# Patient Record
Sex: Male | Born: 1965 | Race: White | Hispanic: No | Marital: Married | Smoking: Never smoker
Health system: Southern US, Community
[De-identification: ages and names within clinical notes are randomized; demographics above are authoritative.]

## PROBLEM LIST (undated history)

## (undated) DIAGNOSIS — I499 Cardiac arrhythmia, unspecified: Secondary | ICD-10-CM

## (undated) DIAGNOSIS — J45909 Unspecified asthma, uncomplicated: Secondary | ICD-10-CM

## (undated) DIAGNOSIS — S43006A Unspecified dislocation of unspecified shoulder joint, initial encounter: Secondary | ICD-10-CM

## (undated) DIAGNOSIS — M87059 Idiopathic aseptic necrosis of unspecified femur: Secondary | ICD-10-CM

## (undated) DIAGNOSIS — R519 Headache, unspecified: Secondary | ICD-10-CM

## (undated) DIAGNOSIS — T7840XA Allergy, unspecified, initial encounter: Secondary | ICD-10-CM

## (undated) HISTORY — DX: Headache, unspecified: R51.9

## (undated) HISTORY — DX: Unspecified asthma, uncomplicated: J45.909

## (undated) HISTORY — DX: Cardiac arrhythmia, unspecified: I49.9

## (undated) HISTORY — DX: Allergy, unspecified, initial encounter: T78.40XA

## (undated) HISTORY — PX: OTHER SURGICAL HISTORY: SHX169

## (undated) HISTORY — DX: Idiopathic aseptic necrosis of unspecified femur: M87.059

## (undated) HISTORY — DX: Unspecified dislocation of unspecified shoulder joint, initial encounter: S43.006A

---
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
## 1988-04-17 HISTORY — PX: WISDOM TOOTH EXTRACTION: SHX21

## 1990-04-17 HISTORY — PX: CYSTECTOMY: SUR359

## 1990-04-17 HISTORY — PX: VASECTOMY: SHX75

## 2000-04-17 HISTORY — PX: OTHER SURGICAL HISTORY: SHX169

## 2005-04-17 HISTORY — PX: OTHER SURGICAL HISTORY: SHX169

## 2006-09-11 ENCOUNTER — Encounter: Payer: Self-pay | Admitting: Internal Medicine

## 2006-09-11 ENCOUNTER — Ambulatory Visit: Payer: Self-pay | Admitting: Internal Medicine

## 2006-09-11 DIAGNOSIS — J45909 Unspecified asthma, uncomplicated: Secondary | ICD-10-CM | POA: Insufficient documentation

## 2006-09-11 LAB — CONVERTED CEMR LAB
ALT: 25 units/L (ref 0–40)
AST: 22 units/L (ref 0–37)
Albumin: 4.3 g/dL (ref 3.5–5.2)
Alkaline Phosphatase: 40 units/L (ref 39–117)
BUN: 12 mg/dL (ref 6–23)
Basophils Absolute: 0 10*3/uL (ref 0.0–0.1)
Basophils Relative: 0.3 % (ref 0.0–1.0)
Bilirubin, Direct: 0.1 mg/dL (ref 0.0–0.3)
CO2: 34 meq/L — ABNORMAL HIGH (ref 19–32)
Calcium: 9.4 mg/dL (ref 8.4–10.5)
Chloride: 103 meq/L (ref 96–112)
Cholesterol: 189 mg/dL (ref 0–200)
Creatinine, Ser: 1.1 mg/dL (ref 0.4–1.5)
Eosinophils Absolute: 0.3 10*3/uL (ref 0.0–0.6)
Eosinophils Relative: 3.7 % (ref 0.0–5.0)
GFR calc Af Amer: 95 mL/min
GFR calc non Af Amer: 79 mL/min
Glucose, Bld: 100 mg/dL — ABNORMAL HIGH (ref 70–99)
HCT: 45.2 % (ref 39.0–52.0)
HDL: 52 mg/dL (ref >39.0)
Hemoglobin: 15.5 g/dL (ref 13.0–17.0)
LDL Cholesterol: 117 mg/dL — ABNORMAL HIGH (ref 0–99)
Lymphocytes Relative: 35.4 % (ref 12.0–46.0)
MCHC: 34.3 g/dL (ref 30.0–36.0)
MCV: 94.7 fL (ref 78.0–100.0)
Monocytes Absolute: 0.6 10*3/uL (ref 0.2–0.7)
Monocytes Relative: 6.6 % (ref 3.0–11.0)
Neutro Abs: 4.5 10*3/uL (ref 1.4–7.7)
Neutrophils Relative %: 54 % (ref 43.0–77.0)
Platelets: 196 10*3/uL (ref 150–400)
Potassium: 3.7 meq/L (ref 3.5–5.1)
RBC: 4.77 M/uL (ref 4.22–5.81)
RDW: 12 % (ref 11.5–14.6)
Sodium: 141 meq/L (ref 135–145)
TSH: 1.57 microintl units/mL (ref 0.35–5.50)
Total Bilirubin: 0.9 mg/dL (ref 0.3–1.2)
Total CHOL/HDL Ratio: 3.6
Total Protein: 7.2 g/dL (ref 6.0–8.3)
Triglycerides: 99 mg/dL (ref 0–149)
VLDL: 20 mg/dL (ref 0–40)
WBC: 8.4 10*3/uL (ref 4.5–10.5)

## 2007-07-17 ENCOUNTER — Encounter: Payer: Self-pay | Admitting: Internal Medicine

## 2007-08-22 ENCOUNTER — Telehealth: Payer: Self-pay | Admitting: Internal Medicine

## 2007-08-22 ENCOUNTER — Emergency Department (HOSPITAL_COMMUNITY): Admission: EM | Admit: 2007-08-22 | Discharge: 2007-08-22 | Payer: Self-pay | Admitting: Emergency Medicine

## 2007-08-26 ENCOUNTER — Ambulatory Visit: Payer: Self-pay | Admitting: Internal Medicine

## 2007-08-26 DIAGNOSIS — L03317 Cellulitis of buttock: Secondary | ICD-10-CM

## 2007-08-26 DIAGNOSIS — L0231 Cutaneous abscess of buttock: Secondary | ICD-10-CM | POA: Insufficient documentation

## 2010-11-28 ENCOUNTER — Telehealth: Payer: Self-pay | Admitting: *Deleted

## 2010-11-28 NOTE — Telephone Encounter (Signed)
Last tetanus 2008---doesn't need one now

## 2010-11-28 NOTE — Telephone Encounter (Signed)
Pt aware.

## 2010-11-28 NOTE — Telephone Encounter (Signed)
[facility] Triage Call Report Triage Record Num: [id number] Operator: Audelia Hives Patient Name: Albert Schmidt Call Date & Time: 11/27/2010 2:46:37PM Patient Phone: [phone] PCP: Valetta Mole. Swords Patient Gender: Male PCP Fax : [phone] Patient DOB: 1965/07/10 [facility] Name: [facility] Reason for Call: [name] calling regarding stepped on rusty nail, barely broke the skin. No bleeding. Unsure if Tetanus UTD. Cleaned well with soap and water.Emergent s/s for Abrasions, Lacerations, Puncture Wounds r/o per protocol except for see in 24 hours since unsure if tetanus UTD, will call in am to see if UTD. Protocol(s) Used: Abrasions, Lacerations, Puncture Wounds Recommended Outcome per Protocol: See Provider within 24 hours Reason for Outcome: Tetanus immunization not up to date Care Advice: Call provider if wound or area around wound becomes increasingly red or painful, there is a purulent or foul smelling discharge, red streaks develop leading away from wound, or if you develop any temperature elevation. ~ ~ IMMEDIATE ACTION Tetanus immunization must be given as soon as possible after injury, usually within 72 hours, IF: - immunization status is unknown, never immunized, or fewer than 3 doses given, - it has been 10 years or more since last immunization; - OR if it has been 5 years or more since last booster, AND wound is a deep, is a puncture wound, or is a tetanus-prone wound. Keep an up-to-date record of your immunizations so unnecessary repeat doses are not given. ~ 11/27/2010 3:01:24PM Page 1 of 1 [id number]

## 2013-02-17 ENCOUNTER — Encounter: Payer: Self-pay | Admitting: Nurse Practitioner

## 2013-02-20 ENCOUNTER — Ambulatory Visit: Payer: Self-pay | Admitting: Nurse Practitioner

## 2017-01-17 LAB — HM COLONOSCOPY

## 2020-01-24 ENCOUNTER — Other Ambulatory Visit: Payer: Self-pay

## 2020-01-24 ENCOUNTER — Emergency Department (HOSPITAL_COMMUNITY): Payer: BC Managed Care – PPO

## 2020-01-24 ENCOUNTER — Emergency Department (HOSPITAL_COMMUNITY)
Admission: EM | Admit: 2020-01-24 | Discharge: 2020-01-24 | Disposition: A | Discharge status: Home / Self Care | Payer: BC Managed Care – PPO | Attending: Emergency Medicine | Admitting: Emergency Medicine

## 2020-01-24 DIAGNOSIS — S43005A Unspecified dislocation of left shoulder joint, initial encounter: Secondary | ICD-10-CM

## 2020-01-24 DIAGNOSIS — Z87891 Personal history of nicotine dependence: Secondary | ICD-10-CM | POA: Insufficient documentation

## 2020-01-24 DIAGNOSIS — S4992XA Unspecified injury of left shoulder and upper arm, initial encounter: Secondary | ICD-10-CM | POA: Diagnosis present

## 2020-01-24 DIAGNOSIS — W010XXA Fall on same level from slipping, tripping and stumbling without subsequent striking against object, initial encounter: Secondary | ICD-10-CM | POA: Insufficient documentation

## 2020-01-24 MED ORDER — FENTANYL CITRATE (PF) 100 MCG/2ML IJ SOLN
50.0000 ug | Freq: Once | INTRAMUSCULAR | Status: AC
  Administered 2020-01-24: 50 ug via INTRAVENOUS
  Filled 2020-01-24: qty 2

## 2020-01-24 MED ORDER — DIAZEPAM 5 MG/ML IJ SOLN
5.0000 mg | Freq: Once | INTRAMUSCULAR | Status: AC
  Administered 2020-01-24: 5 mg via INTRAVENOUS
  Filled 2020-01-24: qty 2

## 2020-01-24 MED ORDER — LIDOCAINE HCL (PF) 1 % IJ SOLN
10.0000 mg | Freq: Once | INTRAMUSCULAR | Status: AC
  Administered 2020-01-24: 10 mg
  Filled 2020-01-24: qty 10

## 2020-01-24 MED ORDER — HYDROMORPHONE HCL 1 MG/ML IJ SOLN: 1.0000 mg | Freq: Once | INTRAMUSCULAR | Status: DC

## 2020-01-24 MED ORDER — HYDROCODONE-ACETAMINOPHEN 5-325 MG PO TABS
1.0000 | ORAL_TABLET | Freq: Four times a day (QID) | ORAL | Status: DC | PRN
  Administered 2020-01-24: 1 via ORAL
  Filled 2020-01-24: qty 1

## 2020-01-24 NOTE — Discharge Instructions (Addendum)
Your shoulder was reduced.  I placed you in a sling, please wear for first week and then he can wear as needed..  I recommend taking over-the-counter pain medication like ibuprofen and or Tylenol every 6 hours as needed please follow dosing the back of bottle.  You may apply ice to the area as this will help decrease inflammation and swelling.  It is more they follow-up with Ortho for further evaluation.  Come back to emergency department if develop pins and needle sensation in her hand, if worsening shoulder pain, chest pain, shortness breath, severe abdominal pain, uncontrolled nausea, vomiting, diarrhea.

## 2020-01-24 NOTE — ED Provider Notes (Signed)
[facility] Provider Note   CSN: [id number] Arrival date & time: 01/24/20  0844     History Chief Complaint  Patient presents with  . Shoulder Injury    Albert Schmidt is a 54 y.o. male.  HPI   Patient with significant medical history of avascular necrosis of femoral head as well as prior left shoulder dislocation presents to the emergency department with chief complaint of left shoulder pain possible shoulder dislocation.  Patient states he was outside when he slipped on the wet ground causing him to fall forward,  he threw his arm out onto the deck to catch himself and felt his left shoulder pop.  He denies hitting his head, losing conscious, being on anticoags.  He states he had immediate pain in his left shoulder and was unable to lift it or move his shoulder.  He denies paresthesias, weakness, difficulty moving his wrist or fingers on his left side.  He has not taking any pain medication.  He denies any alleviating factors.  Patient denies headache, fever, chills, shortness breath, chest pain, dumping, nausea vomiting or diarrhea.  Past Medical History:  Diagnosis Date  . Avascular necrosis of femoral head    Bilaterally-     Patient Active Problem List   Diagnosis Date Noted  . CELLULITIS/ABSCESS, BUTTOCK 08/26/2007  . ASTHMA 09/11/2006        Family History  Problem Relation Age of Onset  . Migraines Mother     Social History   Tobacco Use  . Smoking status: Former Smoker    Quit date: 02/03/2013    Years since quitting: 6.9  Substance Use Topics  . Alcohol use: Yes    Comment: daily  . Drug use: No    Home Medications Prior to Admission medications   Medication Sig Start Date End Date Taking? Authorizing Provider  DULoxetine (CYMBALTA) 60 MG capsule Take 60 mg by mouth daily.    [provider]  fluticasone (FLONASE) 50 MCG/ACT nasal spray Place 2 sprays into the nose daily.    [provider]    naproxen sodium (ANAPROX) 220 MG tablet Take 220 mg by mouth 2 (two) times daily with a meal.    [provider]  nortriptyline (PAMELOR) 50 MG capsule Take 50 mg by mouth at bedtime. 2 tabs    [provider]  SUMAtriptan (IMITREX) 100 MG tablet Take 100 mg by mouth as needed for migraine. May repeat in 2 hours if headache persists or recurs.    [provider]  traMADol (ULTRAM) 50 MG tablet Take 50 mg by mouth every 6 (six) hours as needed for pain.    [provider]    Allergies    Patient has no known allergies.  Review of Systems   Review of Systems  Constitutional: Negative for chills and fever.  HENT: Negative for congestion, tinnitus, trouble swallowing and voice change.   Eyes: Negative for visual disturbance.  Respiratory: Negative for cough and shortness of breath.   Cardiovascular: Negative for chest pain.  Gastrointestinal: Negative for abdominal pain, diarrhea, nausea and vomiting.  Genitourinary: Negative for enuresis, flank pain and frequency.  Musculoskeletal: Negative for back pain.       Left shoulder pain.  Skin: Negative for rash.  Neurological: Negative for dizziness and headaches.  Hematological: Does not bruise/bleed easily.    Physical Exam Updated Vital Signs BP 119/72   Pulse (!) 57   Temp 98.4 F (36.9 C) (Oral)  Resp 18   Ht 5\' 8"  (1.727 m)   Wt 88.5 kg   SpO2 96%   BMI 29.65 kg/m   Physical Exam Vitals and nursing note reviewed.  Constitutional:      General: He is in acute distress.     Appearance: He is not ill-appearing.  HENT:     Head: Normocephalic and atraumatic.     Nose: No congestion.     Mouth/Throat:     Mouth: Mucous membranes are moist.     Pharynx: Oropharynx is clear.  Eyes:     General: No scleral icterus. Cardiovascular:     Rate and Rhythm: Normal rate and regular rhythm.     Pulses: Normal pulses.     Heart sounds: No murmur heard.  No friction rub. No gallop.    Pulmonary:     Effort: No respiratory distress.     Breath sounds: No wheezing, rhonchi or rales.  Abdominal:     General: There is no distension.     Palpations: Abdomen is soft.     Tenderness: There is no abdominal tenderness. There is no right CVA tenderness, left CVA tenderness or guarding.  Musculoskeletal:        General: Tenderness and deformity present. No swelling.     Right lower leg: No edema.     Left lower leg: No edema.     Comments: Patient's left shoulder was visualized he had a squared looking shoulder, unable to flex, extend, adductor AB duct at the shoulder, he did have full range of motion 5 out of strength at the elbow wrist and fingers.  Neurovascular fully intact.  Skin:    General: Skin is warm and dry.     Capillary Refill: Capillary refill takes less than 2 seconds.     Findings: No rash.  Neurological:     Mental Status: He is alert.  Psychiatric:        Mood and Affect: Mood normal.     ED Results / Procedures / Treatments   Labs (all labs ordered are listed, but only abnormal results are displayed) Labs Reviewed - No data to display  EKG None  Radiology DG Shoulder Left  Result Date: 01/24/2020 CLINICAL DATA:  Fall on left shoulder while walking dog today. Left shoulder pain and bruising. EXAM: LEFT SHOULDER - 2+ VIEW COMPARISON:  None. FINDINGS: Anterior dislocation of the humeral head is seen. No fractures or other bone lesions identified. IMPRESSION: Anterior shoulder dislocation. No evidence of fracture. Electronically Signed   By: Marlaine Hind M.D.   On: 01/24/2020 10:09   DG Shoulder Left Portable  Result Date: 01/24/2020 CLINICAL DATA:  Post reduction EXAM: LEFT SHOULDER COMPARISON:  January 24, 2020 FINDINGS: Interval relocation of the LEFT shoulder. Small Hill-Sachs deformity. No definitive glenoid fracture visualized. No additional fracture noted. Soft tissues are unremarkable. IMPRESSION: Interval relocation of the LEFT shoulder.  Electronically Signed   By: Valentino Saxon MD   On: 01/24/2020 12:07    Procedures Reduction of dislocation  Date/Time: 01/24/2020 11:04 AM Performed by: Marcello Fennel, PA-C Authorized by: Marcello Fennel, PA-C  Preparation: Patient was prepped and draped in the usual sterile fashion. Local anesthesia used: yes Anesthesia: hematoma block  Anesthesia: Local anesthesia used: yes Local Anesthetic: lidocaine 1% without epinephrine Anesthetic total: 10 mL  Sedation: Patient sedated: no  Patient tolerance: patient tolerated the procedure well with no immediate complications Comments: Shoulder was successfully reduced, he had full range of motion at  the shoulder and neurovascular is fully intact.    (including critical care time)  Medications Ordered in ED Medications  HYDROcodone-acetaminophen (NORCO/VICODIN) 5-325 MG per tablet 1 tablet (1 tablet Oral Given 01/24/20 0925)  HYDROmorphone (DILAUDID) injection 1 mg (1 mg Intravenous Not Given 01/24/20 0910)  diazepam (VALIUM) injection 5 mg (5 mg Intravenous Given 01/24/20 0955)  fentaNYL (SUBLIMAZE) injection 50 mcg (50 mcg Intravenous Given 01/24/20 1013)  lidocaine (PF) (XYLOCAINE) 1 % injection 10 mg (10 mg Infiltration Given by Other 01/24/20 3094)    ED Course  I have reviewed the triage vital signs and the nursing notes.  Pertinent labs & imaging results that were available during my care of the patient were reviewed by me and considered in my medical decision making (see chart for details).    MDM Rules/Calculators/A&P                          I have personally reviewed all imaging, labs and have interpreted them.  Patient presents with left shoulder pain.  He was alert, appeared to be in acute distress, vital signs reassuring.  Will order portable left shoulder x-ray for further evaluation.  Shoulder shows anterior shoulder dislocation no evidence of fracture.  Will proceed with hematoma block.  Will provide  patient with 2 mg of Valium, 50 fentanyl and 10 mg of lidocaine 1% without epi.  Shoulder reduction was successful able to reduce dislocation without difficulty.  Neurovascular is fully intact.  Will order portable x-ray for reevaluation.  Shoulder x-ray shows interval relocation of left shoulder with no other acute abnormalities.  Low suspicion for compartment syndrome or nerve damage as he had full range of motion at the shoulder, elbow, wrist and fingers as well as neurovascular intact after the procedure was performed.  Low suspicion for fracture or dislocation as x-ray does not show any acute abnormalities.  Low suspicion for ligament or tendon damage as he had full range of motion at the shoulder, elbow, wrist, fingers after procedure.  I suspect patient had a shoulder dislocation which was successfully rereduced.  Will place patient in a sling and have him follow-up with Ortho for further evaluation.  Vital signs have remained stable, no indication for hospital admission.  Patient discussed with attending who agrees assessment plan.  Patient was given at home capital strict return precautions.  Patient verbalized understood agreed to plan. Final Clinical Impression(s) / ED Diagnoses Final diagnoses:  Dislocation of left shoulder joint, initial encounter    Rx / DC Orders ED Discharge Orders    None       Marcello Fennel, PA-C 01/24/20 1224    Gareth Morgan, MD 01/25/20 [phone]

## 2020-01-24 NOTE — ED Triage Notes (Signed)
Pt slipped on a tree root while walking his dog this morning, reached out to catch himself, resulting in L shoulder pain and deformity. Dislocated once in his 12s with no problem since then.

## 2020-01-24 NOTE — ED Notes (Signed)
Pt requesting something for pain.  

## 2020-01-24 NOTE — Progress Notes (Signed)
Orthopedic Tech Progress Note Patient Details:  Albert Schmidt 1965/05/19 [id number]  Ortho Devices Type of Ortho Device: Shoulder immobilizer Ortho Device/Splint Location: LUE Ortho Device/Splint Interventions: Ordered, Application, Adjustment   Post Interventions Patient Tolerated: Well Instructions Provided: Care of device, Adjustment of device, Poper ambulation with device   [name] 01/24/2020, 11:50 AM

## 2020-01-26 ENCOUNTER — Telehealth: Payer: Self-pay

## 2020-01-26 NOTE — Telephone Encounter (Signed)
Scheduled patient F/U with Dr. Ninfa Linden for left shoulder.

## 2020-01-28 ENCOUNTER — Encounter: Payer: Self-pay | Admitting: Orthopaedic Surgery

## 2020-01-28 ENCOUNTER — Ambulatory Visit: Payer: BC Managed Care – PPO | Admitting: Orthopaedic Surgery

## 2020-01-28 DIAGNOSIS — S43015D Anterior dislocation of left humerus, subsequent encounter: Secondary | ICD-10-CM

## 2020-01-28 NOTE — Progress Notes (Signed)
Office Visit Note   Patient: Albert Schmidt           Date of Birth: October 21, 1965           MRN: [id number] Visit Date: 01/28/2020              Requested by: No referring provider defined for this encounter. PCP: Patient, No Pcp Per   Assessment & Plan: Visit Diagnoses:  1. Anterior dislocation of left shoulder, subsequent encounter     Plan: He will be careful with his left shoulder over the next few weeks.  He will come in and out of sling as comfort allows but avoid overhead activities and reaching behind him with the left side.  Also avoid external rotation.  We will see him back in 2 weeks with repeat 3 views of the left shoulder.  All questions and concerns were answered and addressed.  Follow-Up Instructions: Return in about 2 weeks (around 02/11/2020).   Orders:  No orders of the defined types were placed in this encounter.  No orders of the defined types were placed in this encounter.     Procedures: No procedures performed   Clinical Data: No additional findings.   Subjective: Chief Complaint  Patient presents with  . 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  He sustained an accidental mechanical fall walking his dog Saturday and he sustained a left shoulder anterior dislocation.  He was seen in the emergency room and this was reduced by the ER staff.  He does report a previous dislocation about 25 years ago with that shoulder when he was in graduate school.  He said he rehabbed through it and it never had any issues with the shoulder prior to this most recent fall.  He denies any numbness and tingling with his left upper extremity.  He has been wearing a sling and compliant with sling but does come out of it for hygiene and does not sleep in a sling.  HPI  Review of Systems There is currently listed no headache, chest pain, shortness of breath, fever, chills, nausea, vomiting  Objective: Vital Signs: There were  no vitals taken for this visit.  Physical Exam He is alert and orient x3 and in no acute distress Ortho Exam Examination of his left shoulder shows clinically it is well located.  He can use his rotator cuff to abduct the shoulder to 90 degrees on his own. Specialty Comments:  No specialty comments available.  Imaging: No results found.  Pre and post reduction x-rays of the left shoulder show an anterior dislocation and then a postreduction film shows the shoulder well located. PMFS History: Patient Active Problem List   Diagnosis Date Noted  . CELLULITIS/ABSCESS, BUTTOCK 08/26/2007  . ASTHMA 09/11/2006   Past Medical History:  Diagnosis Date  . Avascular necrosis of femoral head (HCC)    Bilaterally-     Family History  Problem Relation Age of Onset  . Migraines Mother     Past Surgical History:  Procedure Laterality Date  . Left hip replaced  2002  . Right femur  2007  . Right hip    . VASECTOMY  1992   Social History   Occupational History    Employer: [employer]  Tobacco Use  . Smoking status: Former Smoker    Quit date: 02/03/2013    Years since quitting: 6.9  Substance and Sexual Activity  . Alcohol use: Yes  Comment: daily  . Drug use: No  . Sexual activity: Not on file

## 2020-02-11 ENCOUNTER — Ambulatory Visit: Payer: BC Managed Care – PPO | Admitting: Orthopaedic Surgery

## 2020-02-11 ENCOUNTER — Ambulatory Visit: Payer: Self-pay

## 2020-02-11 ENCOUNTER — Encounter: Payer: Self-pay | Admitting: Orthopaedic Surgery

## 2020-02-11 DIAGNOSIS — S43015D Anterior dislocation of left humerus, subsequent encounter: Secondary | ICD-10-CM | POA: Diagnosis not present

## 2020-02-11 NOTE — Progress Notes (Signed)
The patient is now 2 and half weeks status post a left shoulder dislocation.  He is 54 years old.  This happened after hard mechanical fall.  This shoulder was reduced by the ER staff.  He has been wearing his sling and coming out of it only for hygiene purposes.  On exam his shoulder is clinically well located on the left side.  He does have a positive apprehension sign but he is moving the shoulder well and the rotator cuff seems to be intact.  3 views left shoulder show that it is well located and in good position with no acute findings.  At this point he can stop wearing his sling.  He will avoid external rotation of the shoulder and the extremes of abduction.  I like to see him back in 4 weeks for repeat exam and determine at that point whether or not therapy would be warranted.  If he does have symptoms of instability of the shoulder we will consider a MRI arthrogram but we will only do that if it presents itself clinically.  All questions and concerns were answered and addressed.

## 2020-03-09 ENCOUNTER — Ambulatory Visit: Payer: BC Managed Care – PPO | Admitting: Orthopaedic Surgery

## 2020-03-09 ENCOUNTER — Encounter: Payer: Self-pay | Admitting: Orthopaedic Surgery

## 2020-03-09 DIAGNOSIS — S43015D Anterior dislocation of left humerus, subsequent encounter: Secondary | ICD-10-CM

## 2020-03-09 NOTE — Progress Notes (Signed)
HPI: Albert Schmidt returns today now just 6 weeks status post left shoulder dislocation.  He has had no further injury to the left shoulder.  He feels his range of motion is improving.  Some soreness.  Does not awaken him at night denies any numbness tingling down the arm.  Physical exam left shoulder forward flexion 170 degrees.  Negative apprehension test.  5-5 strength with external/internal rotation against resistance and empty can test on the left.  Impression: Status post left shoulder dislocation 01/24/2020  Plan: We will send him to formal therapy to work on range of motion strengthening of the left shoulder.  We will see him back in a month to see how he is doing overall.  Questions encouraged and answered at length.

## 2020-04-07 ENCOUNTER — Ambulatory Visit: Payer: BC Managed Care – PPO | Admitting: Orthopaedic Surgery

## 2021-01-17 ENCOUNTER — Ambulatory Visit: Payer: BC Managed Care – PPO | Admitting: Internal Medicine

## 2021-01-17 ENCOUNTER — Encounter: Payer: Self-pay | Admitting: Internal Medicine

## 2021-01-17 ENCOUNTER — Other Ambulatory Visit: Payer: Self-pay

## 2021-01-17 VITALS — BP 126/84 | HR 63 | Temp 98.4°F | Resp 16 | Ht 68.0 in | Wt 186.0 lb

## 2021-01-17 DIAGNOSIS — Z125 Encounter for screening for malignant neoplasm of prostate: Secondary | ICD-10-CM

## 2021-01-17 DIAGNOSIS — Z23 Encounter for immunization: Secondary | ICD-10-CM | POA: Diagnosis not present

## 2021-01-17 DIAGNOSIS — L309 Dermatitis, unspecified: Secondary | ICD-10-CM

## 2021-01-17 DIAGNOSIS — D229 Melanocytic nevi, unspecified: Secondary | ICD-10-CM | POA: Diagnosis not present

## 2021-01-17 DIAGNOSIS — B351 Tinea unguium: Secondary | ICD-10-CM | POA: Diagnosis not present

## 2021-01-17 DIAGNOSIS — Z1159 Encounter for screening for other viral diseases: Secondary | ICD-10-CM | POA: Insufficient documentation

## 2021-01-17 DIAGNOSIS — Z0001 Encounter for general adult medical examination with abnormal findings: Secondary | ICD-10-CM | POA: Diagnosis not present

## 2021-01-17 DIAGNOSIS — M79675 Pain in left toe(s): Secondary | ICD-10-CM | POA: Diagnosis not present

## 2021-01-17 LAB — BASIC METABOLIC PANEL
BUN: 19 mg/dL (ref 6–23)
CO2: 29 mEq/L (ref 19–32)
Calcium: 9.9 mg/dL (ref 8.4–10.5)
Chloride: 102 mEq/L (ref 96–112)
Creatinine, Ser: 1.11 mg/dL (ref 0.40–1.50)
GFR: 74.96 mL/min (ref >60.00)
Glucose, Bld: 95 mg/dL (ref 70–99)
Potassium: 4.6 mEq/L (ref 3.5–5.1)
Sodium: 138 mEq/L (ref 135–145)

## 2021-01-17 LAB — CBC WITH DIFFERENTIAL/PLATELET
Basophils Absolute: 0 10*3/uL (ref 0.0–0.1)
Basophils Relative: 0.6 % (ref 0.0–3.0)
Eosinophils Absolute: 0.2 10*3/uL (ref 0.0–0.7)
Eosinophils Relative: 3.3 % (ref 0.0–5.0)
HCT: 44.8 % (ref 39.0–52.0)
Hemoglobin: 15 g/dL (ref 13.0–17.0)
Lymphocytes Relative: 32.2 % (ref 12.0–46.0)
Lymphs Abs: 1.9 10*3/uL (ref 0.7–4.0)
MCHC: 33.6 g/dL (ref 30.0–36.0)
MCV: 98.6 fl (ref 78.0–100.0)
Monocytes Absolute: 0.4 10*3/uL (ref 0.1–1.0)
Monocytes Relative: 6.6 % (ref 3.0–12.0)
Neutro Abs: 3.3 10*3/uL (ref 1.4–7.7)
Neutrophils Relative %: 57.3 % (ref 43.0–77.0)
Platelets: 166 10*3/uL (ref 150.0–400.0)
RBC: 4.55 Mil/uL (ref 4.22–5.81)
RDW: 13 % (ref 11.5–15.5)
WBC: 5.8 10*3/uL (ref 4.0–10.5)

## 2021-01-17 LAB — HEPATIC FUNCTION PANEL
ALT: 26 U/L (ref 0–53)
AST: 22 U/L (ref 0–37)
Albumin: 4.8 g/dL (ref 3.5–5.2)
Alkaline Phosphatase: 41 U/L (ref 39–117)
Bilirubin, Direct: 0.1 mg/dL (ref 0.0–0.3)
Total Bilirubin: 0.7 mg/dL (ref 0.2–1.2)
Total Protein: 7.2 g/dL (ref 6.0–8.3)

## 2021-01-17 LAB — LIPID PANEL
Cholesterol: 190 mg/dL (ref 0–200)
HDL: 69.6 mg/dL (ref >39.00)
LDL Cholesterol: 107 mg/dL — ABNORMAL HIGH (ref 0–99)
NonHDL: 120.37
Total CHOL/HDL Ratio: 3
Triglycerides: 68 mg/dL (ref 0.0–149.0)
VLDL: 13.6 mg/dL (ref 0.0–40.0)

## 2021-01-17 LAB — PSA: PSA: 0.51 ng/mL (ref 0.10–4.00)

## 2021-01-17 MED ORDER — FLUOCINONIDE EMULSIFIED BASE 0.05 % EX CREA: 1.0000 "application " | TOPICAL_CREAM | Freq: Two times a day (BID) | CUTANEOUS | 2 refills | Status: AC

## 2021-01-17 MED ORDER — TERBINAFINE HCL 250 MG PO TABS: 250.0000 mg | ORAL_TABLET | Freq: Every day | ORAL | 0 refills | Status: DC

## 2021-01-17 NOTE — Progress Notes (Deleted)
   Subjective:  Patient ID: Albert Schmidt, male    DOB: 01-31-1966  Age: 55 y.o. MRN: [id number]  CC: No chief complaint on file.   HPI Albert Schmidt presents for ***  History Albert Schmidt has a past medical history of Avascular necrosis of femoral head ([place]).   He has a past surgical history that includes Right hip; Right femur (2007); Left hip replaced (2002); and Vasectomy (1992).   His family history includes Migraines in his mother.He reports that he quit smoking about 7 years ago. He does not have any smokeless tobacco history on file. He reports current alcohol use. He reports that he does not use drugs.  Outpatient Medications Prior to Visit  Medication Sig Dispense Refill   DULoxetine (CYMBALTA) 60 MG capsule Take 60 mg by mouth daily.     fluticasone (FLONASE) 50 MCG/ACT nasal spray Place 2 sprays into the nose daily.     naproxen sodium (ANAPROX) 220 MG tablet Take 220 mg by mouth 2 (two) times daily with a meal.     nortriptyline (PAMELOR) 50 MG capsule Take 50 mg by mouth at bedtime. 2 tabs     SUMAtriptan (IMITREX) 100 MG tablet Take 100 mg by mouth as needed for migraine. May repeat in 2 hours if headache persists or recurs.     traMADol (ULTRAM) 50 MG tablet Take 50 mg by mouth every 6 (six) hours as needed for pain.     No facility-administered medications prior to visit.    ROS Review of Systems  Objective:  Ht 5\' 8"  (1.727 m)   Wt 186 lb (84.4 kg)   BMI 28.28 kg/m   Physical Exam  Lab Results  Component Value Date   WBC 8.4 09/11/2006   HGB 15.5 09/11/2006   HCT 45.2 09/11/2006   PLT 196 09/11/2006   GLUCOSE 100 (H) 09/11/2006   CHOL 189 09/11/2006   TRIG 99 09/11/2006   HDL 52.0 09/11/2006   LDLCALC 117 (H) 09/11/2006   ALT 25 09/11/2006   AST 22 09/11/2006   NA 141 09/11/2006   K 3.7 09/11/2006   CL 103 09/11/2006   CREATININE 1.1 09/11/2006   BUN 12 09/11/2006   CO2 34 (H) 09/11/2006   TSH 1.57 09/11/2006     Assessment & Plan:   There  are no diagnoses linked to this encounter. I have discontinued Albert Schmidt. Albert Schmidt's SUMAtriptan, DULoxetine, nortriptyline, fluticasone, traMADol, and naproxen sodium.  No orders of the defined types were placed in this encounter.    Follow-up: No follow-ups on file.  Scarlette Calico, MD

## 2021-01-17 NOTE — Progress Notes (Addendum)
Subjective:  Patient ID: Albert Schmidt, male    DOB: 1965-09-25  Age: 55 y.o. MRN: [id number]  CC: Annual Exam and Rash  This visit occurred during the SARS-CoV-2 public health emergency.  Safety protocols were in place, including screening questions prior to the visit, additional usage of staff PPE, and extensive cleaning of exam room while observing appropriate contact time as indicated for disinfecting solutions.    HPI Albert Schmidt presents for a CPX and to establish.  He complains of a several month hx of painful and discolored left great toenail.  He has had a hand rash and itching for many years and is having a recurrent outbreak that has not responded to OTC products. He complains of atypical moles. He is active and denies CP/DOE/diaphoresis/edema/fatigue.  History Albert Schmidt has a past medical history of Allergy, Arrhythmia, Asthma, Avascular necrosis of femoral head ([place]), Dislocated shoulder, and Frequent headaches.   He has a past surgical history that includes Vasectomy (04/17/1990); Bone tumor excision (1982); Wisdom tooth extraction (1990); Cystectomy (1992); and Tonsillectomy (1974).   His family history includes Alcohol abuse in his paternal grandfather; Anxiety disorder in his daughter; COPD in his maternal grandmother; Cancer in his maternal grandmother; Depression in his daughter; Diabetes in his maternal grandmother; Drug abuse in his paternal grandfather; Healthy in his brother, daughter, son, and son; Hearing loss in his father; Heart attack in his paternal grandfather; Heart disease in his maternal grandfather and paternal grandfather; Hyperlipidemia in his father, maternal grandmother, and mother; Hypertension in his maternal grandfather and maternal grandmother; Migraines in his mother.He reports that he quit smoking about 7 years ago. His smoking use included cigarettes. He has never used smokeless tobacco. He reports that he does not currently use alcohol. He  reports that he does not use drugs.  Outpatient Medications Prior to Visit  Medication Sig Dispense Refill   DULoxetine (CYMBALTA) 60 MG capsule Take 60 mg by mouth daily.     fluticasone (FLONASE) 50 MCG/ACT nasal spray Place 2 sprays into the nose daily.     naproxen sodium (ANAPROX) 220 MG tablet Take 220 mg by mouth 2 (two) times daily with a meal.     nortriptyline (PAMELOR) 50 MG capsule Take 50 mg by mouth at bedtime. 2 tabs     SUMAtriptan (IMITREX) 100 MG tablet Take 100 mg by mouth as needed for migraine. May repeat in 2 hours if headache persists or recurs.     traMADol (ULTRAM) 50 MG tablet Take 50 mg by mouth every 6 (six) hours as needed for pain.     No facility-administered medications prior to visit.    ROS Review of Systems  Constitutional:  Negative for chills, diaphoresis, fatigue and fever.  HENT: Negative.    Eyes: Negative.   Respiratory:  Negative for cough, chest tightness, shortness of breath and wheezing.   Cardiovascular:  Negative for chest pain, palpitations and leg swelling.  Gastrointestinal:  Negative for abdominal pain, blood in stool, constipation, diarrhea, nausea and vomiting.  Endocrine: Negative.   Genitourinary: Negative.  Negative for difficulty urinating, dysuria, hematuria, scrotal swelling, testicular pain and urgency.  Musculoskeletal: Negative.  Negative for arthralgias and myalgias.  Skin: Negative.  Negative for color change.  Neurological: Negative.  Negative for dizziness, weakness, light-headedness, numbness and headaches.  Hematological:  Negative for adenopathy. Does not bruise/bleed easily.  Psychiatric/Behavioral: Negative.     Objective:  BP 126/84 (BP Location: Right Arm, Patient Position: Sitting,  Cuff Size: Large)   Pulse 63   Temp 98.4 F (36.9 C) (Oral)   Resp 16   Ht 5\' 8"  (1.727 m)   Wt 186 lb (84.4 kg)   SpO2 98%   BMI 28.28 kg/m   Physical Exam Vitals reviewed.  HENT:     Nose: Nose normal.      Mouth/Throat:     Mouth: Mucous membranes are moist.  Eyes:     General: No scleral icterus.    Conjunctiva/sclera: Conjunctivae normal.  Cardiovascular:     Rate and Rhythm: Normal rate and regular rhythm.     Heart sounds: No murmur heard. Pulmonary:     Effort: Pulmonary effort is normal.     Breath sounds: No stridor. No wheezing, rhonchi or rales.  Abdominal:     General: Abdomen is flat.     Palpations: There is no mass.     Tenderness: There is no abdominal tenderness. There is no guarding or rebound.     Hernia: No hernia is present. There is no hernia in the left inguinal area or right inguinal area.  Genitourinary:    Pubic Area: No rash.      Penis: Normal and circumcised.      Testes: Normal.        Right: Mass or tenderness not present.        Left: Mass or tenderness not present.     Epididymis:     Right: Normal.     Left: Normal.     Prostate: Normal. Not enlarged, not tender and no nodules present.     Rectum: Normal. Guaiac result negative. No mass, tenderness, anal fissure, external hemorrhoid or internal hemorrhoid. Normal anal tone.  Musculoskeletal:        General: Normal range of motion.     Cervical back: Neck supple.  Lymphadenopathy:     Cervical: No cervical adenopathy.     Lower Body: No right inguinal adenopathy. No left inguinal adenopathy.  Skin:    General: Skin is warm and dry.     Findings: Erythema and rash present. Rash is papular, scaling and vesicular. Rash is not nodular, purpuric, pustular or urticarial.     Comments: Both hands - tiny vesicles, papules, erythema, scaling  Neurological:     General: No focal deficit present.     Mental Status: He is alert. Mental status is at baseline.  Psychiatric:        Mood and Affect: Mood normal.        Behavior: Behavior normal.    Lab Results  Component Value Date   WBC 5.8 01/17/2021   HGB 15.0 01/17/2021   HCT 44.8 01/17/2021   PLT 166.0 01/17/2021   GLUCOSE 95 01/17/2021   CHOL 190  01/17/2021   TRIG 68.0 01/17/2021   HDL 69.60 01/17/2021   LDLCALC 107 (H) 01/17/2021   ALT 26 01/17/2021   AST 22 01/17/2021   NA 138 01/17/2021   K 4.6 01/17/2021   CL 102 01/17/2021   CREATININE 1.11 01/17/2021   BUN 19 01/17/2021   CO2 29 01/17/2021   TSH 1.57 09/11/2006   PSA 0.51 01/17/2021     Assessment & Plan:   Vander was seen today for annual exam and rash.  Diagnoses and all orders for this visit:  Pain due to onychomycosis of toenail of left foot- Will treat with systemic terbinafine. -     CBC with Differential/Platelet; Future -     Basic metabolic  panel; Future -     Hepatic function panel; Future -     Hepatic function panel -     Basic metabolic panel -     CBC with Differential/Platelet -     terbinafine (LAMISIL) 250 MG tablet; Take 1 tablet (250 mg total) by mouth daily.  Encounter for general adult medical examination with abnormal findings- Exam completed, labs reviewed, vaccines reviewed and updated, cancer screenings are up-to-date, patient education was given. -     Lipid panel; Future -     PSA; Future -     Hepatitis C antibody; Future -     HIV Antibody (routine testing w rflx); Future -     HIV Antibody (routine testing w rflx) -     Hepatitis C antibody -     PSA -     Lipid panel  Chronic eczema of hand -     fluocinonide-emollient (LIDEX-E) 0.05 % cream; Apply 1 application topically 2 (two) times daily.  Need for hepatitis C screening test -     Hepatitis C antibody; Future -     Hepatitis C antibody  Multiple atypical nevi -     Ambulatory referral to Dermatology  Other orders -     Flu Vaccine QUAD 6+ mos PF IM (Fluarix Quad PF) -     Tdap vaccine greater than or equal to 7yo IM  I have discontinued Juel Burrow "Mike"'s SUMAtriptan, DULoxetine, nortriptyline, fluticasone, traMADol, and naproxen sodium. I am also having him start on fluocinonide-emollient and terbinafine.  Meds ordered this encounter  Medications    fluocinonide-emollient (LIDEX-E) 0.05 % cream    Sig: Apply 1 application topically 2 (two) times daily.    Dispense:  60 g    Refill:  2   terbinafine (LAMISIL) 250 MG tablet    Sig: Take 1 tablet (250 mg total) by mouth daily.    Dispense:  30 tablet    Refill:  0      Follow-up: Return in about 4 weeks (around 02/14/2021).  Scarlette Calico, MD

## 2021-01-17 NOTE — Patient Instructions (Signed)
Health Maintenance, Male Adopting a healthy lifestyle and getting preventive care are important in promoting health and wellness. Ask your health care provider about: The right schedule for you to have regular tests and exams. Things you can do on your own to prevent diseases and keep yourself healthy. What should I know about diet, weight, and exercise? Eat a healthy diet  Eat a diet that includes plenty of vegetables, fruits, low-fat dairy products, and lean protein. Do not eat a lot of foods that are high in solid fats, added sugars, or sodium. Maintain a healthy weight Body mass index (BMI) is a measurement that can be used to identify possible weight problems. It estimates body fat based on height and weight. Your health care provider can help determine your BMI and help you achieve or maintain a healthy weight. Get regular exercise Get regular exercise. This is one of the most important things you can do for your health. Most adults should: Exercise for at least 150 minutes each week. The exercise should increase your heart rate and make you sweat (moderate-intensity exercise). Do strengthening exercises at least twice a week. This is in addition to the moderate-intensity exercise. Spend less time sitting. Even light physical activity can be beneficial. Watch cholesterol and blood lipids Have your blood tested for lipids and cholesterol at 55 years of age, then have this test every 5 years. You may need to have your cholesterol levels checked more often if: Your lipid or cholesterol levels are high. You are older than 55 years of age. You are at high risk for heart disease. What should I know about cancer screening? Many types of cancers can be detected early and may often be prevented. Depending on your health history and family history, you may need to have cancer screening at various ages. This may include screening for: Colorectal cancer. Prostate cancer. Skin cancer. Lung  cancer. What should I know about heart disease, diabetes, and high blood pressure? Blood pressure and heart disease High blood pressure causes heart disease and increases the risk of stroke. This is more likely to develop in people who have high blood pressure readings, are of African descent, or are overweight. Talk with your health care provider about your target blood pressure readings. Have your blood pressure checked: Every 3-5 years if you are 18-39 years of age. Every year if you are 40 years old or older. If you are between the ages of 65 and 75 and are a current or former smoker, ask your health care provider if you should have a one-time screening for abdominal aortic aneurysm (AAA). Diabetes Have regular diabetes screenings. This checks your fasting blood sugar level. Have the screening done: Once every three years after age 45 if you are at a normal weight and have a low risk for diabetes. More often and at a younger age if you are overweight or have a high risk for diabetes. What should I know about preventing infection? Hepatitis B If you have a higher risk for hepatitis B, you should be screened for this virus. Talk with your health care provider to find out if you are at risk for hepatitis B infection. Hepatitis C Blood testing is recommended for: Everyone born from 1945 through 1965. Anyone with known risk factors for hepatitis C. Sexually transmitted infections (STIs) You should be screened each year for STIs, including gonorrhea and chlamydia, if: You are sexually active and are younger than 55 years of age. You are older than 55 years   of age and your health care provider tells you that you are at risk for this type of infection. Your sexual activity has changed since you were last screened, and you are at increased risk for chlamydia or gonorrhea. Ask your health care provider if you are at risk. Ask your health care provider about whether you are at high risk for HIV.  Your health care provider may recommend a prescription medicine to help prevent HIV infection. If you choose to take medicine to prevent HIV, you should first get tested for HIV. You should then be tested every 3 months for as long as you are taking the medicine. Follow these instructions at home: Lifestyle Do not use any products that contain nicotine or tobacco, such as cigarettes, e-cigarettes, and chewing tobacco. If you need help quitting, ask your health care provider. Do not use street drugs. Do not share needles. Ask your health care provider for help if you need support or information about quitting drugs. Alcohol use Do not drink alcohol if your health care provider tells you not to drink. If you drink alcohol: Limit how much you have to 0-2 drinks a day. Be aware of how much alcohol is in your drink. In the [place] one drink equals one 12 oz bottle of beer (355 mL), one 5 oz glass of wine (148 mL), or one 1 oz glass of hard liquor (44 mL). General instructions Schedule regular health, dental, and eye exams. Stay current with your vaccines. Tell your health care provider if: You often feel depressed. You have ever been abused or do not feel safe at home. Summary Adopting a healthy lifestyle and getting preventive care are important in promoting health and wellness. Follow your health care provider's instructions about healthy diet, exercising, and getting tested or screened for diseases. Follow your health care provider's instructions on monitoring your cholesterol and blood pressure. This information is not intended to replace advice given to you by your health care provider. Make sure you discuss any questions you have with your health care provider. Document Revised: 06/11/2020 Document Reviewed: 03/27/2018 Elsevier Patient Education  2022 Elsevier Inc.  

## 2021-01-18 ENCOUNTER — Encounter: Payer: Self-pay | Admitting: Internal Medicine

## 2021-01-18 LAB — HEPATITIS C ANTIBODY
Hepatitis C Ab: NONREACTIVE
SIGNAL TO CUT-OFF: 0.02 (ref <1.00)

## 2021-01-18 LAB — HIV ANTIBODY (ROUTINE TESTING W REFLEX): HIV 1&2 Ab, 4th Generation: NONREACTIVE

## 2021-01-24 ENCOUNTER — Telehealth: Payer: Self-pay

## 2021-01-24 ENCOUNTER — Telehealth: Payer: Self-pay | Admitting: Dermatology

## 2021-01-24 NOTE — Telephone Encounter (Signed)
This patient's referral was just generated today and was not yet assigned to an office. I did add a note to this referral informing that patient did not want to wait until April to be seen at this office.

## 2021-01-24 NOTE — Telephone Encounter (Signed)
Patient is calling for a referral appointment from Scarlette Calico, MD.  Patient does not want to wait until April 2023 for an appointment so would like referral sent back to Dr. Ronnald Ramp' office.

## 2021-01-24 NOTE — Telephone Encounter (Signed)
Please advise as the pt has stated the referral that was sent to dermatologist stated they can not see him until April. Pt is asking that a new referral be sent to [facility] TEL: [phone].

## 2021-02-15 NOTE — Progress Notes (Signed)
Subjective:    Patient ID: Albert Schmidt, male    DOB: 24-Jul-1965, 55 y.o.   MRN: [id number]  This visit occurred during the SARS-CoV-2 public health emergency.  Safety protocols were in place, including screening questions prior to the visit, additional usage of staff PPE, and extensive cleaning of exam room while observing appropriate contact time as indicated for disinfecting solutions.     HPI The patient is here for follow up of his onychomycosis.  He was started on oral terbinafine one month ago.    He is tolerating the medication well.  He denies side effects - no abdominal pain, no change in stool, no fever, nausea, gerd.  He would like to continue with the medication for the full 3 months.   Medications and allergies reviewed with patient and updated if appropriate.  Patient Active Problem List   Diagnosis Date Noted   Pain due to onychomycosis of toenail of left foot 01/17/2021   Encounter for general adult medical examination with abnormal findings 01/17/2021   Chronic eczema of hand 01/17/2021   Need for hepatitis C screening test 01/17/2021   Multiple atypical nevi 01/17/2021    Current Outpatient Medications on File Prior to Visit  Medication Sig Dispense Refill   fluocinonide-emollient (LIDEX-E) 0.05 % cream Apply 1 application topically 2 (two) times daily. 60 g 2   No current facility-administered medications on file prior to visit.    Past Medical History:  Diagnosis Date   Allergy    Arrhythmia    Asthma    Avascular necrosis of femoral head ([place])    Bilaterally-    Dislocated shoulder    left, 1993, 2021   Frequent headaches     Past Surgical History:  Procedure Laterality Date   BONE TUMOR [place]  04/17/1990   WISDOM TOOTH EXTRACTION  1990    Social History   Socioeconomic History   Marital status: Married    Spouse name: Not on file   Number of children: 3   Years of  education: Masters   Highest education level: Not on file  Occupational History    Employer: [employer]   Tobacco Use   Smoking status: Former    Types: Cigarettes    Quit date: 02/03/2013    Years since quitting: 8.0   Smokeless tobacco: Never  Substance and Sexual Activity   Alcohol use: Yes    Alcohol/week: 2.0 standard drinks    Types: 2 Cans of beer per week    Comment: daily   Drug use: No   Sexual activity: Yes    Partners: Female  Other Topics Concern   Not on file  Social History Narrative   Patient lives at home with his wife.   Patient has 3 children.   Patient works for [employer].   Patient is left-handed.   Patient has his Masters.   Patient drinks two cups of caffeine daily.   Social Determinants of Health   Financial Resource Strain: Not on file  Food Insecurity: Not on file  Transportation Needs: Not on file  Physical Activity: Not on file  Stress: Not on file  Social Connections: Not on file    Family History  Problem Relation Age of Onset   Hyperlipidemia Mother    Migraines Mother    Hearing loss Father    Hyperlipidemia Father    Healthy Brother  Anxiety disorder Daughter    Healthy Daughter    Depression Daughter    Healthy Son    Healthy Son    Hyperlipidemia Maternal Grandmother    Hypertension Maternal Grandmother    Diabetes Maternal Grandmother    COPD Maternal Grandmother    Cancer Maternal Grandmother        colon   Hypertension Maternal Grandfather    Heart disease Maternal Grandfather    Heart disease Paternal Grandfather    Drug abuse Paternal Grandfather    Alcohol abuse Paternal Grandfather    Heart attack Paternal Grandfather     Review of Systems     Objective:   Vitals:   02/16/21 0837  BP: 110/80  Pulse: 65  Temp: 98.2 F (36.8 C)  SpO2: 97%   BP Readings from Last 3 Encounters:  02/16/21 110/80  01/17/21 126/84  01/24/20 129/82   Wt Readings from Last 3 Encounters:  02/16/21 187 lb (84.8 kg)   01/17/21 186 lb (84.4 kg)  01/24/20 195 lb (88.5 kg)   Body mass index is 28.43 kg/m.   Physical Exam       Assessment & Plan:    Painful onychomycosis left first toenail: Chronic Completed one month of terbinafine w/o side effects Will check hepatic function today Continue terbinafine for 2 additional months - he will touch base with Dr Ronnald Ramp after the next month Discussed side effects to monitor for

## 2021-02-16 ENCOUNTER — Encounter: Payer: Self-pay | Admitting: Internal Medicine

## 2021-02-16 ENCOUNTER — Ambulatory Visit (INDEPENDENT_AMBULATORY_CARE_PROVIDER_SITE_OTHER): Payer: BC Managed Care – PPO | Admitting: Internal Medicine

## 2021-02-16 ENCOUNTER — Other Ambulatory Visit: Payer: Self-pay

## 2021-02-16 VITALS — BP 110/80 | HR 65 | Temp 98.2°F | Ht 68.0 in | Wt 187.0 lb

## 2021-02-16 DIAGNOSIS — M79675 Pain in left toe(s): Secondary | ICD-10-CM

## 2021-02-16 DIAGNOSIS — B351 Tinea unguium: Secondary | ICD-10-CM | POA: Diagnosis not present

## 2021-02-16 LAB — HEPATIC FUNCTION PANEL
ALT: 30 U/L (ref 0–53)
AST: 40 U/L — ABNORMAL HIGH (ref 0–37)
Albumin: 4.6 g/dL (ref 3.5–5.2)
Alkaline Phosphatase: 45 U/L (ref 39–117)
Bilirubin, Direct: 0.1 mg/dL (ref 0.0–0.3)
Total Bilirubin: 0.5 mg/dL (ref 0.2–1.2)
Total Protein: 7.2 g/dL (ref 6.0–8.3)

## 2021-02-16 MED ORDER — TERBINAFINE HCL 250 MG PO TABS: 250.0000 mg | ORAL_TABLET | Freq: Every day | ORAL | 1 refills | Status: DC

## 2021-02-16 NOTE — Patient Instructions (Signed)
    Blood work was ordered.     Medications changes include :  continue the terbinafine     Your prescription(s) have been submitted to your pharmacy. Please take as directed and contact our office if you believe you are having problem(s) with the medication(s).

## 2021-02-21 ENCOUNTER — Telehealth: Payer: Self-pay | Admitting: Internal Medicine

## 2021-02-21 NOTE — Telephone Encounter (Signed)
Pt scheduled for tomorrow 11/8 @ 9.20am

## 2021-02-21 NOTE — Telephone Encounter (Signed)
Patient states it is not uncommon for him to experience heart fluttering periodically  Patient states since taking terbinafine (LAMISIL) 250 MG tablet his heart is fluttering  more consistent  Patient is requesting a call back to discuss symptoms  Patient denied any other symptoms at this time

## 2021-02-22 ENCOUNTER — Encounter: Payer: Self-pay | Admitting: Internal Medicine

## 2021-02-22 ENCOUNTER — Other Ambulatory Visit: Payer: Self-pay

## 2021-02-22 ENCOUNTER — Ambulatory Visit: Payer: BC Managed Care – PPO | Admitting: Internal Medicine

## 2021-02-22 VITALS — BP 126/78 | HR 64 | Temp 98.5°F | Resp 16 | Ht 68.0 in | Wt 183.0 lb

## 2021-02-22 DIAGNOSIS — I491 Atrial premature depolarization: Secondary | ICD-10-CM | POA: Diagnosis not present

## 2021-02-22 DIAGNOSIS — R7989 Other specified abnormal findings of blood chemistry: Secondary | ICD-10-CM | POA: Diagnosis not present

## 2021-02-22 DIAGNOSIS — R002 Palpitations: Secondary | ICD-10-CM | POA: Diagnosis not present

## 2021-02-22 LAB — PROTIME-INR
INR: 1.1 ratio — ABNORMAL HIGH (ref 0.8–1.0)
Prothrombin Time: 12.3 s (ref 9.6–13.1)

## 2021-02-22 LAB — HEPATIC FUNCTION PANEL
ALT: 28 U/L (ref 0–53)
AST: 23 U/L (ref 0–37)
Albumin: 4.6 g/dL (ref 3.5–5.2)
Alkaline Phosphatase: 40 U/L (ref 39–117)
Bilirubin, Direct: 0.1 mg/dL (ref 0.0–0.3)
Total Bilirubin: 0.6 mg/dL (ref 0.2–1.2)
Total Protein: 7.2 g/dL (ref 6.0–8.3)

## 2021-02-22 NOTE — Progress Notes (Signed)
Subjective:  Patient ID: ROC STREETT, male    DOB: [birth year]  Age: 55 y.o. MRN: [id number]  CC: Palpitations  This visit occurred during the SARS-CoV-2 public health emergency.  Safety protocols were in place, including screening questions prior to the visit, additional usage of staff PPE, and extensive cleaning of exam room while observing appropriate contact time as indicated for disinfecting solutions.    HPI KAAN TOSH presents for f/up -  He describes having intermittent palpitations his entire adult life.  He states the palpitations have been more noticeable for the last 2 weeks.  He thinks terbinafine is attributed to this.  He describes intermittent episodes of missed beats, skipped beats, extra beats, and fluttering.  All of the symptoms happen at rest.  He denies chest pain, shortness of breath, dyspnea on exertion, diaphoresis, dizziness, lightheadedness, edema, or presyncope.  He recently had labs done by someone else and his liver enzymes were mildly elevated.  Outpatient Medications Prior to Visit  Medication Sig Dispense Refill   fluocinonide-emollient (LIDEX-E) 0.05 % cream Apply 1 application topically 2 (two) times daily. 60 g 2   terbinafine (LAMISIL) 250 MG tablet Take 1 tablet (250 mg total) by mouth daily. 30 tablet 1   No facility-administered medications prior to visit.    ROS Review of Systems  Constitutional:  Negative for chills, diaphoresis, fatigue and fever.  HENT: Negative.    Eyes: Negative.   Respiratory:  Negative for cough, chest tightness, shortness of breath and wheezing.   Cardiovascular:  Negative for chest pain, palpitations and leg swelling.  Gastrointestinal:  Negative for abdominal pain, diarrhea, nausea and vomiting.  Endocrine: Negative.   Genitourinary: Negative.  Negative for difficulty urinating.  Musculoskeletal:  Negative for arthralgias.  Skin: Negative.  Negative for color change.  Neurological:  Negative for dizziness,  weakness and light-headedness.  Hematological:  Negative for adenopathy. Does not bruise/bleed easily.  Psychiatric/Behavioral: Negative.     Objective:  BP 126/78 (BP Location: Left Arm, Patient Position: Sitting, Cuff Size: Large)   Pulse 64   Temp 98.5 F (36.9 C) (Oral)   Resp 16   Ht 5\' 8"  (1.727 m)   Wt 183 lb (83 kg)   SpO2 98%   BMI 27.83 kg/m   BP Readings from Last 3 Encounters:  02/22/21 126/78  02/16/21 110/80  01/17/21 126/84    Wt Readings from Last 3 Encounters:  02/22/21 183 lb (83 kg)  02/16/21 187 lb (84.8 kg)  01/17/21 186 lb (84.4 kg)    Physical Exam Vitals reviewed.  HENT:     Nose: Nose normal.     Mouth/Throat:     Mouth: Mucous membranes are moist.  Eyes:     General: No scleral icterus.    Conjunctiva/sclera: Conjunctivae normal.  Cardiovascular:     Rate and Rhythm: Normal rate. Occasional Extrasystoles are present.    Heart sounds: Normal heart sounds, S1 normal and S2 normal. No murmur heard.    Comments: EKG- SR with PAC's, 61 bpm Otherwise normal EKG Pulmonary:     Effort: Pulmonary effort is normal.     Breath sounds: No stridor. No wheezing, rhonchi or rales.  Abdominal:     General: Abdomen is flat.     Palpations: There is no mass.     Tenderness: There is no abdominal tenderness. There is no guarding.     Hernia: No hernia is present.  Musculoskeletal:        General: Normal range  of motion.     Cervical back: Neck supple.  Skin:    General: Skin is warm.  Neurological:     General: No focal deficit present.     Mental Status: He is alert. Mental status is at baseline.  Psychiatric:        Attention and Perception: Attention normal.        Mood and Affect: Mood is anxious.        Speech: Speech normal.        Behavior: Behavior normal.        Thought Content: Thought content normal.    Lab Results  Component Value Date   WBC 5.8 01/17/2021   HGB 15.0 01/17/2021   HCT 44.8 01/17/2021   PLT 166.0 01/17/2021    GLUCOSE 95 01/17/2021   CHOL 190 01/17/2021   TRIG 68.0 01/17/2021   HDL 69.60 01/17/2021   LDLCALC 107 (H) 01/17/2021   ALT 28 02/22/2021   AST 23 02/22/2021   NA 138 01/17/2021   K 4.6 01/17/2021   CL 102 01/17/2021   CREATININE 1.11 01/17/2021   BUN 19 01/17/2021   CO2 29 01/17/2021   TSH 2.57 02/22/2021   PSA 0.51 01/17/2021   INR 1.1 (H) 02/22/2021    DG Shoulder Left  Result Date: 01/24/2020 CLINICAL DATA:  Fall on left shoulder while walking dog today. Left shoulder pain and bruising. EXAM: LEFT SHOULDER - 2+ VIEW COMPARISON:  None. FINDINGS: Anterior dislocation of the humeral head is seen. No fractures or other bone lesions identified. IMPRESSION: Anterior shoulder dislocation. No evidence of fracture. Electronically Signed   By: Marlaine Hind M.D.   On: 01/24/2020 10:09   DG Shoulder Left Portable  Result Date: 01/24/2020 CLINICAL DATA:  Post reduction EXAM: LEFT SHOULDER COMPARISON:  January 24, 2020 FINDINGS: Interval relocation of the LEFT shoulder. Small Hill-Sachs deformity. No definitive glenoid fracture visualized. No additional fracture noted. Soft tissues are unremarkable. IMPRESSION: Interval relocation of the LEFT shoulder. Electronically Signed   By: Valentino Saxon MD   On: 01/24/2020 12:07    Assessment & Plan:   Brook was seen today for palpitations.  Diagnoses and all orders for this visit:  PAC (premature atrial contraction)- Though he is symptomatic with this he does not want to take a medication to treat it.  His labs are negative for secondary causes. -     EKG 12-Lead -     Thyroid Panel With TSH; Future -     CARDIAC EVENT MONITOR; Future -     Thyroid Panel With TSH  Palpitations- I recommended that he undergo an event monitor to be certain he does not have an additional dysrhythmia. -     EKG 12-Lead -     Thyroid Panel With TSH; Future -     CARDIAC EVENT MONITOR; Future -     Thyroid Panel With TSH  Elevated LFTs- His LFTs are normal  now.  Screening for viral hepatitis is negative.  I recommended that he get vaccinated against hepatitis A and B. -     Hepatitis B surface antigen; Future -     Hepatitis A antibody, total; Future -     Hepatitis B core antibody, total; Future -     Hepatitis B surface antibody,qualitative; Future -     Protime-INR; Future -     Hepatic function panel; Future -     Hepatic function panel -     Protime-INR -  Hepatitis B surface antibody,qualitative -     Hepatitis B core antibody, total -     Hepatitis A antibody, total -     Hepatitis B surface antigen  I have discontinued Juel Burrow "Mike"'s terbinafine. I am also having him maintain his fluocinonide-emollient.  No orders of the defined types were placed in this encounter.    Follow-up: Return in about 3 months (around 05/25/2021).  Scarlette Calico, MD

## 2021-02-22 NOTE — Patient Instructions (Signed)
Premature Atrial Contraction A premature atrial contraction [facility]) is a kind of irregular heartbeat (arrhythmia). It happens when the heart beats too early and then pauses before beating again. The heart has four areas, or chambers. Normally, electrical signals spread across the heart and make all the chambers beat together. During a PAC, the upper chambers of the heart (atria) beat too early, before they have had time to fill with blood. The heartbeat pauses afterward so the heart can fill with blood for the next beat. Sometimes PAC can be a warning sign of another type of arrhythmia called atrial fibrillation. Atrial fibrillation may allow blood to pool in the atria and form clots. If a clot travels to the brain, it can cause a stroke. What are the causes? The cause of this condition is often unknown. Sometimes, this condition may be caused by heart disease or injury to the heart. What increases the risk? You are more likely to develop this condition if: You are a child. You are an adult who is 78 years of age or older. Episodes may be triggered by: Caffeine. Alcohol. Tobacco use. Stimulant drugs. Some medicines or supplements. Stress. Heart disease. What are the signs or symptoms? Symptoms of this condition include: A feeling that your heart skipped a beat. The first heartbeat after the "skipped" beat may feel more forceful. A feeling that your heart is fluttering. How is this diagnosed? This condition is diagnosed based on: Your symptoms. A physical exam. Your health care provider may listen to your heart. An electrocardiogram (ECG). This is a test that records the electrical impulses of the heart. An ambulatory cardiac monitor. This device records your heartbeats for 24 hours or more. You may also have: An echocardiogram to check for any heart conditions. This is a type of imaging test that uses sound waves (ultrasound) to make images of your heart. Blood tests. How is this  treated? Treatment depends on the frequency of your symptoms and other risk factors. Treatments may include: Medicines (beta-blockers). Catheter ablation. This is done to destroy the part of the heart tissue that sends abnormal signals. In some cases, treatment may not be needed for this condition. Follow these instructions at home: Lifestyle Do not use any products that contain nicotine or tobacco, such as cigarettes, e-cigarettes, and chewing tobacco. If you need help quitting, ask your health care provider. Exercise regularly. Ask your health care provider what type of exercise is safe for you. Find healthy ways to manage stress. Try to get at least 7-9 hours of sleep each night, or as much as recommended by your health care provider. Alcohol use Do not drink alcohol if: Your health care provider tells you not to drink. You are pregnant, may be pregnant, or are planning to become pregnant. Alcohol triggers your episodes. If you drink alcohol: Limit how much you use to: 0-1 drink a day for women. 0-2 drinks a day for men. Be aware of how much alcohol is in your drink. In the [place] one drink equals one 12 oz bottle of beer (355 mL), one 5 oz glass of wine (148 mL), or one 1 oz glass of hard liquor (44 mL). General instructions Take over-the-counter and prescription medicines only as told by your health care provider. If caffeine triggers episodes, do not eat, drink, or use anything with caffeine in it. Keep all follow-up visits as told by your health care provider. This is important. Contact a health care provider if: You feel your heart skipping beats. Your  heart skips beats and you feel dizzy, light-headed, or very tired. Get help right away if you have: Chest pain. Trouble breathing. Any symptoms of a stroke. "BE FAST" is an easy way to remember the main warning signs of a stroke. B - Balance. Signs are dizziness, sudden trouble walking, or loss of balance. E - Eyes. Signs are  trouble seeing or a sudden change in vision. F - Face. Signs are sudden weakness or numbness of the face, or the face or eyelid drooping on one side. A - Arms. Signs are weakness or numbness in an arm. This happens suddenly and usually on one side of the body. S - Speech. Signs are sudden trouble speaking, slurred speech, or trouble understanding what people say. T - Time. Time to call emergency services. Write down what time symptoms started. Other signs of stroke, such as: A sudden, severe headache with no known cause. Nausea or vomiting. Seizure. These symptoms may represent a serious problem that is an emergency. Do not wait to see if the symptoms will go away. Get medical help right away. Call your local emergency services (911 in the [place] Do not drive yourself to the hospital. Summary A premature atrial contraction [facility]) is a kind of irregular heartbeat (arrhythmia). It happens when the heart beats too early and then pauses before beating again. Treatment depends on your symptoms and whether you have other underlying heart conditions. Contact a health care provider if your heart skips beats and you feel dizzy, light-headed, or very tired. In some cases, this condition may lead to a stroke. "BE FAST" is an easy way to remember the warning signs of stroke. Get help right away if you have any of the "BE FAST" signs. This information is not intended to replace advice given to you by your health care provider. Make sure you discuss any questions you have with your health care provider. Document Revised: 08/31/2020 Document Reviewed: 08/31/2020 Elsevier Patient Education  [place].

## 2021-02-23 LAB — HEPATITIS B SURFACE ANTIBODY,QUALITATIVE: Hep B S Ab: NONREACTIVE

## 2021-02-23 LAB — HEPATITIS B SURFACE ANTIGEN: Hepatitis B Surface Ag: NONREACTIVE

## 2021-02-23 LAB — THYROID PANEL WITH TSH
Free Thyroxine Index: 2 (ref 1.4–3.8)
T3 Uptake: 35 % (ref 22–35)
T4, Total: 5.8 ug/dL (ref 4.9–10.5)
TSH: 2.57 mIU/L (ref 0.40–4.50)

## 2021-02-23 LAB — HEPATITIS B CORE ANTIBODY, TOTAL: Hep B Core Total Ab: NONREACTIVE

## 2021-02-23 LAB — HEPATITIS A ANTIBODY, TOTAL: Hepatitis A AB,Total: NONREACTIVE

## 2021-03-08 ENCOUNTER — Ambulatory Visit (INDEPENDENT_AMBULATORY_CARE_PROVIDER_SITE_OTHER): Payer: BC Managed Care – PPO

## 2021-03-08 DIAGNOSIS — R002 Palpitations: Secondary | ICD-10-CM | POA: Diagnosis not present

## 2021-03-08 DIAGNOSIS — I491 Atrial premature depolarization: Secondary | ICD-10-CM

## 2021-03-21 ENCOUNTER — Telehealth: Payer: Self-pay | Admitting: Internal Medicine

## 2021-03-21 NOTE — Telephone Encounter (Signed)
I have spoke to the pt and recommended that he reach out to who provided the heart monitor to see if they have monitor adhesive placements for sensitive skin. He expressed understanding and would follow up with an updated if needed.

## 2021-03-21 NOTE — Telephone Encounter (Signed)
Patient calling in  Patient says the adhesive that was attached to his heart monitor recently came off  Patient says the adhesive was very uncomfortable & gave him a rash on his skin  Wants to know if provider wants him to continue wearing the monitor for more data or if hes okay to leave the monitor off  Please discuss w/ provider & contact patient [phone]

## 2021-04-13 ENCOUNTER — Telehealth: Payer: Self-pay | Admitting: Internal Medicine

## 2021-04-13 NOTE — Telephone Encounter (Signed)
Called pt, LVM stating 2 weeks of data would be enough and to call the office to discus further if needed. a

## 2021-04-13 NOTE — Telephone Encounter (Signed)
Patient informing provider monitoring company took 2 weeks to send adhesives for sensitive skin  Patient states due to the delay he was unable to restart sleep study due to delay being outside of the time frame  Patient inquiring if the 2 week study previously done will suffice or will a new study have to be ordered   Patient requesting a call back   *see below*

## 2021-04-13 NOTE — Telephone Encounter (Signed)
No note needed 

## 2021-04-25 ENCOUNTER — Other Ambulatory Visit: Payer: Self-pay | Admitting: Internal Medicine

## 2021-04-25 DIAGNOSIS — I4729 Other ventricular tachycardia: Secondary | ICD-10-CM | POA: Insufficient documentation

## 2021-04-26 ENCOUNTER — Encounter: Payer: Self-pay | Admitting: Internal Medicine

## 2021-05-09 ENCOUNTER — Other Ambulatory Visit: Payer: Self-pay | Admitting: Internal Medicine

## 2021-05-09 DIAGNOSIS — I4729 Other ventricular tachycardia: Secondary | ICD-10-CM

## 2021-05-09 NOTE — Telephone Encounter (Signed)
Patient calling in  Says he has not heard back from provider in regards to the ECHO & wants to know what provider recommends he does  Please call [phone]

## 2021-05-26 ENCOUNTER — Other Ambulatory Visit: Payer: Self-pay

## 2021-05-26 ENCOUNTER — Ambulatory Visit (HOSPITAL_COMMUNITY)
Admission: RE | Admit: 2021-05-26 | Discharge: 2021-05-26 | Disposition: A | Discharge status: Home / Self Care | Payer: BC Managed Care – PPO | Source: Ambulatory Visit | Attending: Internal Medicine | Admitting: Internal Medicine

## 2021-05-26 ENCOUNTER — Other Ambulatory Visit: Payer: Self-pay | Admitting: Internal Medicine

## 2021-05-26 DIAGNOSIS — I517 Cardiomegaly: Secondary | ICD-10-CM | POA: Insufficient documentation

## 2021-05-26 DIAGNOSIS — I501 Left ventricular failure: Secondary | ICD-10-CM

## 2021-05-26 DIAGNOSIS — I4729 Other ventricular tachycardia: Secondary | ICD-10-CM | POA: Diagnosis not present

## 2021-05-26 LAB — ECHOCARDIOGRAM COMPLETE
Area-P 1/2: 5.27 cm2
S' Lateral: 3.2 cm

## 2021-05-26 MED ORDER — PERFLUTREN LIPID MICROSPHERE
1.0000 mL | INTRAVENOUS | Status: AC | PRN
  Administered 2021-05-26: 3 mL via INTRAVENOUS
  Filled 2021-05-26: qty 10

## 2021-05-27 ENCOUNTER — Other Ambulatory Visit: Payer: Self-pay | Admitting: Internal Medicine

## 2021-05-27 ENCOUNTER — Encounter: Payer: Self-pay | Admitting: Internal Medicine

## 2021-05-27 DIAGNOSIS — I491 Atrial premature depolarization: Secondary | ICD-10-CM

## 2021-05-27 DIAGNOSIS — I517 Cardiomegaly: Secondary | ICD-10-CM

## 2021-05-27 DIAGNOSIS — I4729 Other ventricular tachycardia: Secondary | ICD-10-CM

## 2021-06-16 ENCOUNTER — Encounter: Payer: Self-pay | Admitting: Internal Medicine

## 2021-06-16 ENCOUNTER — Ambulatory Visit: Payer: BC Managed Care – PPO | Admitting: Internal Medicine

## 2021-06-16 ENCOUNTER — Other Ambulatory Visit: Payer: Self-pay

## 2021-06-16 VITALS — BP 120/80 | HR 62 | Ht 69.0 in | Wt 188.6 lb

## 2021-06-16 DIAGNOSIS — R002 Palpitations: Secondary | ICD-10-CM

## 2021-06-16 NOTE — Patient Instructions (Signed)
Medication Instructions:  No Changes In Medications at this time.  *If you need a refill on your cardiac medications before your next appointment, please call your pharmacy*  Follow-Up: At [facility], you and your health needs are our priority.  As part of our continuing mission to provide you with exceptional heart care, we have created designated Provider Care Teams.  These Care Teams include your primary Cardiologist (physician) and Advanced Practice Providers (APPs -  Physician Assistants and Nurse Practitioners) who all work together to provide you with the care you need, when you need it.  We recommend signing up for the patient portal called "MyChart".  Sign up information is provided on this After Visit Summary.  MyChart is used to connect with patients for Virtual Visits (Telemedicine).  Patients are able to view lab/test results, encounter notes, upcoming appointments, etc.  Non-urgent messages can be sent to your provider as well.   To learn more about what you can do with MyChart, go to [email or web address].    Your next appointment:   AS NEEDED   The format for your next appointment:   In Person  Provider:   Branch, Mary E, MD   

## 2021-06-16 NOTE — Progress Notes (Signed)
?Cardiology Office Note:   ? ?Date:  06/16/2021  ? ?ID:  Albert Schmidt, DOB 1965/10/06, MRN [id number] ? ?PCP:  Janith Lima, MD ?  ?[facility] Providers ?Cardiologist:  Janina Mayo, MD    ? ?Referring MD: Janith Lima, MD  ? ?No chief complaint on file. ?Palpitations ? ?History of Present Illness:   ? ?Albert Schmidt is a 56 y.o. male with a hx of palpitations, referral from Dr. Ronnald Ramp for palpitations and review of cardiac studies ? ?In November, he noted palpitations. He still has some palpitations but they have improved. He notes fainting before after he injured his finger at age 62 or 37. No cardiac history. No stress tests in the past. He has no HTN, no DM2.  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 His echo showed mild LVH, no concern for HCM. He has no valve dx. ? ?When he was in graduate school, he was under stress. He went to [facility] and saw a physician. Got an EKG which showed anterolateral TWI at age 82.  ? ?He notes his mother and brother have TWI. MGF had a pacemaker. PGF had an MI, heavy smoker. ? ? ?Labs 02/22/2021 ?TSH normal ? ?EKG- 02/22/2021: NSR, anterolateral TWI ?Cardiac monitor- sinus rhythm, brief NSVT, no atrial fibrillation ?TTE 05/26/2021- normal EF, mild LVH, no valve disease ? ?The 10-year ASCVD risk score (Arnett DK, et al., 2019) is: 6.6% ?  Values used to calculate the score: ?    Age: 25 years ?    Sex: Male ?    Is Non-Hispanic African American: No ?    Diabetic: No ?    Tobacco smoker: Yes ?    Systolic Blood Pressure: 254 mmHg ?    Is BP treated: No ?    HDL Cholesterol: 69.6 mg/dL ?    Total Cholesterol: 190 mg/dL ? ? ?Past Medical History:  ?Diagnosis Date  ? Allergy   ? Arrhythmia   ? Asthma   ? 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 Bilaterally-   ? Dislocated shoulder   ? left, 1993, 2021  ? Frequent headaches   ? ? ?Past Surgical History:  ?Procedure Laterality Date  ? BONE TUMOR EXCISION  1982  ? CYSTECTOMY  1992  ? TONSILLECTOMY  1974   ? VASECTOMY  04/17/1990  ? [place] EXTRACTION  1990  ? ? ?Current Medications: ?Current Meds  ?Medication Sig  ? acetaminophen (TYLENOL) 500 MG tablet Take 500 mg by mouth every 6 (six) hours as needed.  ? fluocinonide-emollient (LIDEX-E) 0.05 % cream Apply 1 application topically 2 (two) times daily.  ?  ? ?Allergies:   Cat hair extract and Peanut-containing drug products  ? ?Social History  ? ?Socioeconomic History  ? Marital status: Married  ?  Spouse name: Not on file  ? Number of children: 3  ? Years of education: Masters  ? Highest education level: Not on file  ?Occupational History  ?  Employer: [employer]  ?Tobacco Use  ? Smoking status: Former  ?  Types: Cigarettes  ?  Quit date: 02/03/2013  ?  Years since quitting: 8.3  ? Smokeless tobacco: Never  ?Substance and Sexual Activity  ? Alcohol use: Yes  ?  Alcohol/week: 2.0 standard drinks  ?  Types: 2 Cans of beer per week  ?  Comment: daily  ? Drug use: No  ? Sexual activity: Yes  ?  Partners: Female  ?Other  Topics Concern  ? Not on file  ?Social History Narrative  ? Patient lives at home with his wife.  ? Patient has 3 children.  ? Patient works for [employer].  ? Patient is left-handed.  ? Patient has his Masters.  ? Patient drinks two cups of caffeine daily.  ? ?Social Determinants of Health  ? ?Financial Resource Strain: Not on file  ?Food Insecurity: Not on file  ?Transportation Needs: Not on file  ?Physical Activity: Not on file  ?Stress: Not on file  ?Social Connections: Not on file  ?  ? ?Family History: ?The patient's family history includes Alcohol abuse in his paternal grandfather; Anxiety disorder in his daughter; COPD in his maternal grandmother; Cancer in his maternal grandmother; Depression in his daughter; Diabetes in his maternal grandmother; Drug abuse in his paternal grandfather; Healthy in his brother, daughter, son, and son; Hearing loss in his father; Heart attack in his paternal grandfather; Heart disease in his maternal grandfather  and paternal grandfather; Hyperlipidemia in his father, maternal grandmother, and mother; Hypertension in his maternal grandfather and maternal grandmother; Migraines in his mother. ? ?ROS:   ?Please see the history of present illness.    ? All other systems reviewed and are negative. ? ?EKGs/Labs/Other Studies Reviewed:   ? ?The following studies were reviewed today: ? ? ?EKG:  EKG is  ordered today.  The ekg ordered today demonstrates  ? ?NSR, anterolateral TWI ? ?Recent Labs: ?01/17/2021: BUN 19; Creatinine, Ser 1.11; Hemoglobin 15.0; Platelets 166.0; Potassium 4.6; Sodium 138 ?02/22/2021: ALT 28; TSH 2.57  ?Recent Lipid Panel ?   ?Component Value Date/Time  ? CHOL 190 01/17/2021 1053  ? TRIG 68.0 01/17/2021 1053  ? HDL 69.60 01/17/2021 1053  ? CHOLHDL 3 01/17/2021 1053  ? VLDL 13.6 01/17/2021 1053  ? LDLCALC 107 (H) 01/17/2021 1053  ? ? ? ?Risk Assessment/Calculations:   ?  ? ?    ? ?Physical Exam:   ? ?VS:  ? ?Vitals:  ? 06/16/21 0858  ?BP: 120/80  ?Pulse: 62  ?SpO2: 98%  ? ? ? ?Wt Readings from Last 3 Encounters:  ?06/16/21 188 lb 9.6 oz (85.5 kg)  ?02/22/21 183 lb (83 kg)  ?02/16/21 187 lb (84.8 kg)  ?  ? ?GEN:  Well nourished, well developed in no acute distress ?HEENT: Normal ?NECK: No JVD; No carotid bruits ?LYMPHATICS: No lymphadenopathy ?CARDIAC: RRR, no murmurs, rubs, gallops ?RESPIRATORY:  Clear to auscultation without rales, wheezing or rhonchi  ?ABDOMEN: Soft, non-tender, non-distended ?MUSCULOSKELETAL:  No edema; No deformity  ?SKIN: Warm and dry ?NEUROLOGIC:  Alert and oriented x 3 ?PSYCHIATRIC:  Normal affect  ? ?ASSESSMENT:   ? ?#Palpitations: He does not have high risk features including syncope c/f arrhythmia , family hx of SCD. His TWI are benign and not associated with ischemia. We discussed reducing caffeine, staying hydrated incorporating fluids with electrolytes. Otherwise,  Recommend to continue with lifestyle modification and CVD risk mitigation (yearly A1c, lipid monitoring); otherwise he  does not have an indication to continue to follow with cardiology at this time. ? ?PLAN:   ? ?In order of problems listed above: ? ?Follow up as needed ? ?   ? ?   ?Medication Adjustments/Labs and Tests Ordered: ?Current medicines are reviewed at length with the patient today.  Concerns regarding medicines are outlined above.  ?Orders Placed This Encounter  ?Procedures  ? EKG 12-Lead  ? ?No orders of the defined types were placed in this encounter. ? ? ?Patient Instructions  ?  Medication Instructions:  ?No Changes In Medications at this time.  ?*If you need a refill on your cardiac medications before your next appointment, please call your pharmacy* ? ?Follow-Up: ?At [facility], you and your health needs are our priority.  As part of our continuing mission to provide you with exceptional heart care, we have created designated Provider Care Teams.  These Care Teams include your primary Cardiologist (physician) and Advanced Practice Providers (APPs -  Physician Assistants and Nurse Practitioners) who all work together to provide you with the care you need, when you need it. ? ?We recommend signing up for the patient portal called "MyChart".  Sign up information is provided on this After Visit Summary.  MyChart is used to connect with patients for Virtual Visits (Telemedicine).  Patients are able to view lab/test results, encounter notes, upcoming appointments, etc.  Non-urgent messages can be sent to your provider as well.   ?To learn more about what you can do with MyChart, go to [email or web address].   ? ?Your next appointment:   ?AS NEEDED  ? ?The format for your next appointment:   ?In Person ? ?Provider:   ?Janina Mayo, MD   ?  ? ?Signed, ?Janina Mayo, MD  ?06/16/2021 9:47 AM    ?[place] ?

## 2022-02-21 ENCOUNTER — Other Ambulatory Visit: Payer: Self-pay

## 2022-02-21 ENCOUNTER — Emergency Department (HOSPITAL_COMMUNITY): Payer: BC Managed Care – PPO

## 2022-02-21 ENCOUNTER — Emergency Department (HOSPITAL_COMMUNITY)
Admission: EM | Admit: 2022-02-21 | Discharge: 2022-02-21 | Disposition: A | Discharge status: Home / Self Care | Payer: BC Managed Care – PPO | Attending: Emergency Medicine | Admitting: Emergency Medicine

## 2022-02-21 ENCOUNTER — Encounter (HOSPITAL_COMMUNITY): Payer: Self-pay | Admitting: Emergency Medicine

## 2022-02-21 DIAGNOSIS — R002 Palpitations: Secondary | ICD-10-CM

## 2022-02-21 DIAGNOSIS — Z91010 Allergy to peanuts: Secondary | ICD-10-CM | POA: Diagnosis not present

## 2022-02-21 DIAGNOSIS — R7989 Other specified abnormal findings of blood chemistry: Secondary | ICD-10-CM

## 2022-02-21 LAB — BASIC METABOLIC PANEL
Anion gap: 10 (ref 5–15)
BUN: 18 mg/dL (ref 6–20)
CO2: 26 mmol/L (ref 22–32)
Calcium: 9.3 mg/dL (ref 8.9–10.3)
Chloride: 106 mmol/L (ref 98–111)
Creatinine, Ser: 1.28 mg/dL — ABNORMAL HIGH (ref 0.61–1.24)
GFR, Estimated: >60 mL/min (ref >60)
Glucose, Bld: 112 mg/dL — ABNORMAL HIGH (ref 70–99)
Potassium: 4.2 mmol/L (ref 3.5–5.1)
Sodium: 142 mmol/L (ref 135–145)

## 2022-02-21 LAB — CBC WITH DIFFERENTIAL/PLATELET
Abs Immature Granulocytes: 0.01 10*3/uL (ref 0.00–0.07)
Basophils Absolute: 0.1 10*3/uL (ref 0.0–0.1)
Basophils Relative: 1 %
Eosinophils Absolute: 0.4 10*3/uL (ref 0.0–0.5)
Eosinophils Relative: 5 %
HCT: 46.8 % (ref 39.0–52.0)
Hemoglobin: 15.6 g/dL (ref 13.0–17.0)
Immature Granulocytes: 0 %
Lymphocytes Relative: 47 %
Lymphs Abs: 4.1 10*3/uL — ABNORMAL HIGH (ref 0.7–4.0)
MCH: 33.4 pg (ref 26.0–34.0)
MCHC: 33.3 g/dL (ref 30.0–36.0)
MCV: 100.2 fL — ABNORMAL HIGH (ref 80.0–100.0)
Monocytes Absolute: 0.6 10*3/uL (ref 0.1–1.0)
Monocytes Relative: 6 %
Neutro Abs: 3.6 10*3/uL (ref 1.7–7.7)
Neutrophils Relative %: 41 %
Platelets: 169 10*3/uL (ref 150–400)
RBC: 4.67 MIL/uL (ref 4.22–5.81)
RDW: 12.4 % (ref 11.5–15.5)
WBC: 8.7 10*3/uL (ref 4.0–10.5)
nRBC: 0 % (ref 0.0–0.2)

## 2022-02-21 LAB — TROPONIN I (HIGH SENSITIVITY)
Troponin I (High Sensitivity): 23 ng/L — ABNORMAL HIGH (ref <18)
Troponin I (High Sensitivity): 23 ng/L — ABNORMAL HIGH (ref <18)

## 2022-02-21 NOTE — ED Provider Notes (Signed)
[facility] EMERGENCY DEPARTMENT Provider Note   CSN: [id number] Arrival date & time: 02/21/22  0143     History  Chief Complaint  Patient presents with   Palpitations    Albert Schmidt is a 56 y.o. male. With a history of LVH, paroxysmal v tach who presents to the ED for evaluation of heart palpitations. He woke up from his sleep at 1 AM today with the sensation of odd heart beats. Reports this sensation persisted so he decided to come to the ED 30 minutes later. Was initially triaged here and put back into the waiting room. Reports sensation subsided at approximately 6:30 today. Denies ever having chest pain, tightness, shortness of breath, nausea, vomiting, diaphoresis, dizziness, light headedness. Has been seen by cardiology for similar symptoms in the past with no significant findings.   Palpitations      Home Medications Prior to Admission medications   Medication Sig Start Date End Date Taking? Authorizing Provider  acetaminophen (TYLENOL) 500 MG tablet Take 500 mg by mouth every 6 (six) hours as needed.    [provider]  fluocinonide-emollient (LIDEX-E) 0.05 % cream Apply 1 application topically 2 (two) times daily. 01/17/21   Janith Lima, MD      Allergies    Cat hair extract and Peanut-containing drug products    Review of Systems   Review of Systems  Cardiovascular:  Positive for palpitations.  All other systems reviewed and are negative.   Physical Exam Updated Vital Signs BP 122/79   Pulse (!) 58   Temp 98.5 F (36.9 C) (Oral)   Resp 14   SpO2 99%  Physical Exam Vitals and nursing note reviewed.  Constitutional:      General: He is not in acute distress.    Appearance: He is well-developed.  HENT:     Head: Normocephalic and atraumatic.  Eyes:     Conjunctiva/sclera: Conjunctivae normal.  Cardiovascular:     Rate and Rhythm: Normal rate and regular rhythm.     Pulses: Normal pulses.     Heart sounds: Normal heart  sounds. No murmur heard. Pulmonary:     Effort: Pulmonary effort is normal. No respiratory distress.     Breath sounds: Normal breath sounds. No stridor. No wheezing, rhonchi or rales.  Abdominal:     Palpations: Abdomen is soft.     Tenderness: There is no abdominal tenderness.  Musculoskeletal:        General: No swelling.     Cervical back: Neck supple.     Right lower leg: No edema.     Left lower leg: No edema.  Skin:    General: Skin is warm and dry.     Capillary Refill: Capillary refill takes less than 2 seconds.  Neurological:     General: No focal deficit present.     Mental Status: He is alert and oriented to person, place, and time.  Psychiatric:        Mood and Affect: Mood normal.     ED Results / Procedures / Treatments   Labs (all labs ordered are listed, but only abnormal results are displayed) Labs Reviewed  BASIC METABOLIC PANEL - Abnormal; Notable for the following components:      Result Value   Glucose, Bld 112 (*)    Creatinine, Ser 1.28 (*)    All other components within normal limits  CBC WITH DIFFERENTIAL/PLATELET - Abnormal; Notable for the following components:   MCV 100.2 (*)  Lymphs Abs 4.1 (*)    All other components within normal limits  TROPONIN I (HIGH SENSITIVITY) - Abnormal; Notable for the following components:   Troponin I (High Sensitivity) 23 (*)    All other components within normal limits  TROPONIN I (HIGH SENSITIVITY) - Abnormal; Notable for the following components:   Troponin I (High Sensitivity) 23 (*)    All other components within normal limits    EKG EKG Interpretation  Date/Time:  Tuesday February 21 2022 02:01:34 EST Ventricular Rate:  68 PR Interval:  154 QRS Duration: 82 QT Interval:  436 QTC Calculation: 463 R Axis:   84 Text Interpretation: Sinus rhythm with frequent Premature ventricular complexes Possible Left atrial enlargement T wave abnormality, consider anterolateral ischemia Abnormal ECG No previous  ECGs available Confirmed by Delora Fuel ([id number]) on 02/21/2022 3:26:47 AM  Radiology DG Chest 2 View  Result Date: 02/21/2022 CLINICAL DATA:  Irregular heart beat. EXAM: CHEST - 2 VIEW COMPARISON:  None Available. FINDINGS: The heart size and mediastinal contours are within normal limits. Both lungs are clear. The visualized skeletal structures are unremarkable. IMPRESSION: No active cardiopulmonary disease. Electronically Signed   By: Virgina Norfolk M.D.   On: 02/21/2022 02:31    Procedures Procedures    Medications Ordered in ED Medications - No data to display  ED Course/ Medical Decision Making/ A&P           HEART Score: 2                Medical Decision Making This patient presents to the ED for concern of palpitations, this involves an extensive number of treatment options, and is a complaint that carries with it a high risk of complications and morbidity.  The differential diagnosis for palpitations includes cardiac arrhythmias, PVC/PAC, ACS, Cardiomyopathy, CHF, MVP, pericarditis, valvular disease, Panic/Anxiety, Somatic disorder, ETOH, Caffeine,  Stimulant use, medication side effect, Anemia, Hyperthyroidism, pulmonary embolism.    Co morbidities that complicate the patient evaluation  paroxysmal v tach  My initial workup includes ACS work up  Additional history obtained from: Nursing notes from this visit. Previous records within EMR system cardiology visit on 06/16/2021 for evaluation of palpitations which showed brief NSVT  I ordered, reviewed and interpreted labs which include: CBC, BMP, troponin, delta troponin. Creatinine elevated at 1.28, troponin and delta troponin slightly elevated at 23.   I ordered imaging studies including CXR I independently visualized and interpreted imaging which showed normal I agree with the radiologist interpretation  Cardiac Monitoring:  The patient was maintained on a cardiac monitor.  I personally viewed and interpreted the  cardiac monitored which showed an underlying rhythm of: NSR. Initial EKG showed trigeminy with PVCs   Afebrile, hemodynamically stable. Patient is presenting for palpitations. Has a history of palpitations and has seen cardiology recently for this. Cardiology workup was largely unremarkable. Patient had no symptoms other than palpitations. Symptoms have resolved at the time of my evaluation. Workup significant for slightly elevated creatinine, slightly elevated troponin with no change in delta troponin. Physical exam largely unremarkable. Initial EKG showed trigeminy with PVCs. Repeat EKG normal. Patient was likely feeling his arrhythmia. Heart score 2. He has established care with cardiology and will follow up with them. I have low suspicion for ACS. Gave patient strict return precautions. Stable at discharge.   At this time there does not appear to be any evidence of an acute emergency medical condition and the patient appears stable for discharge with appropriate outpatient follow up.  Diagnosis was discussed with patient who verbalizes understanding of care plan and is agreeable to discharge. I have discussed return precautions with patient who verbalizes understanding. Patient encouraged to follow-up with their PCP within 1 week. All questions answered.  Patient's case discussed with Dr. Laverta Baltimore who agrees with plan to discharge with follow-up.   Note: Portions of this report may have been transcribed using voice recognition software. Every effort was made to ensure accuracy; however, inadvertent computerized transcription errors may still be present.          Final Clinical Impression(s) / ED Diagnoses Final diagnoses:  Palpitations  Elevated serum creatinine    Rx / DC Orders ED Discharge Orders     None         Roylene Reason, Hershal Coria 02/21/22 1705    Long, Wonda Olds, MD 02/28/22 [phone]

## 2022-02-21 NOTE — ED Provider Triage Note (Signed)
Emergency Medicine Provider Triage Evaluation Note  Albert Schmidt , a 56 y.o. male  was evaluated in triage.  Pt complains of palpitations. States he woke up around 1am having a funny sensation in his chest like his heart was skipping a beat. States this has persisted and was concerning enough that he decided to come to ED. Denies SOB or chest pain. Feels mildly lightheaded but denies dizziness or syncope.  Review of Systems  Positive: See above Negative:   Physical Exam  BP (!) 142/79   Pulse 71   Temp 97.9 F (36.6 C) (Oral)   Resp 15   SpO2 100%  Gen:   Awake, no distress   Resp:  Normal effort  MSK:   Moves extremities without difficulty  Other:  Frequent PVCs, no murmur  Medical Decision Making  Medically screening exam initiated at 2:08 AM.  Appropriate orders placed.  Albert Schmidt was informed that the remainder of the evaluation will be completed by another provider, this initial triage assessment does not replace that evaluation, and the importance of remaining in the ED until their evaluation is complete.     Albert Hillier, PA-C 02/21/22 [phone]

## 2022-02-21 NOTE — Discharge Instructions (Signed)
You have been seen today for your complaint of palpitations. Your lab work showed an elevated kidney function test, was otherwise reassuring. Your imaging was reassuring and showed no abnormalities. Your discharge medications include water.  You should drink plenty of fluids over the next week to replenish your kidney function. Follow up with: Your cardiologist.  You should call your cardiologist to schedule an appointment for an ED follow-up visit. You should also set an appointment to follow-up with your primary care provider in approximately 1 week regarding your kidney function tests. Please seek immediate medical care if you develop any of the following symptoms: You have chest pain or shortness of breath. You have a severe headache. You feel dizzy or you faint. At this time there does not appear to be the presence of an emergent medical condition, however there is always the potential for conditions to change. Please read and follow the below instructions.  Do not take your medicine if  develop an itchy rash, swelling in your mouth or lips, or difficulty breathing; call 911 and seek immediate emergency medical attention if this occurs.  You may review your lab tests and imaging results in their entirety on your MyChart account.  Please discuss all results of fully with your primary care provider and other specialist at your follow-up visit.  Note: Portions of this text may have been transcribed using voice recognition software. Every effort was made to ensure accuracy; however, inadvertent computerized transcription errors may still be present.

## 2022-02-21 NOTE — ED Triage Notes (Signed)
Patient states he has been having irregular heart beats since about 1am, it woke him from sleep.  No shortness of breath, no chest pain, no nausea of vomiting.  He states that he may feel slight dizziness/lightheadedness.

## 2022-02-24 ENCOUNTER — Telehealth: Payer: Self-pay

## 2022-02-24 NOTE — Telephone Encounter (Signed)
Transition Care Management Unsuccessful Follow-up Telephone Call  Date of discharge and from where:  Al[facility]   Attempts:  1st Attempt  Reason for unsuccessful TCM follow-up call:  Left voice message

## 2022-02-27 ENCOUNTER — Ambulatory Visit: Payer: BC Managed Care – PPO | Attending: Internal Medicine | Admitting: Internal Medicine

## 2022-02-27 ENCOUNTER — Encounter: Payer: Self-pay | Admitting: Internal Medicine

## 2022-02-27 VITALS — BP 116/60 | HR 68 | Ht 69.0 in | Wt 198.4 lb

## 2022-02-27 DIAGNOSIS — R9431 Abnormal electrocardiogram [ECG] [EKG]: Secondary | ICD-10-CM

## 2022-02-27 NOTE — Progress Notes (Signed)
Cardiology Office Note:    Date:  02/27/2022   ID:  Albert Schmidt, DOB 10/20/1965, MRN [id number]  PCP:  Janith Lima, MD   [place] Providers Cardiologist:  Janina Mayo, MD     Referring MD: Janith Lima, MD   No chief complaint on file. Palpitations  History of Present Illness:    Albert Schmidt is a 56 y.o. male with a hx of palpitations, referral from Dr. Ronnald Ramp for palpitations and review of cardiac studies  In November, he noted palpitations. He still has some palpitations but they have improved. He notes fainting before after he injured his finger at age 17 or 28. No cardiac history. No stress tests in the past. He has no HTN, no DM2.  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 His echo showed mild LVH, no concern for HCM. He has no valve dx.  When he was in graduate school, he was under stress. He went to [facility] and saw a physician. Got an EKG which showed anterolateral TWI at age 61.   He notes his mother and brother have TWI. MGF had a pacemaker. PGF had an MI, heavy smoker.   Labs 02/22/2021 TSH normal  EKG- 02/22/2021: NSR, anterolateral TWI Cardiac monitor- sinus rhythm, brief NSVT, no atrial fibrillation TTE 05/26/2021- normal EF, mild LVH, no valve disease  The 10-year ASCVD risk score (Arnett DK, et al., 2019) is: 3.7%   Values used to calculate the score:     Age: 8 years     Sex: Male     Is Non-Hispanic African American: No     Diabetic: No     Tobacco smoker: No     Systolic Blood Pressure: 413 mmHg     Is BP treated: No     HDL Cholesterol: 69.6 mg/dL     Total Cholesterol: 190 mg/dL   Interim Hx 11/13 Albert Schmidt is a 56 y.o. male with a hx of asthma, had palpitations in the early AM 11/7. Had PVCs on his EKG. Crt mildly up. No sig trop. No etoh. No drugs. He was dehydrated.  No anemia. No other symptoms. 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   fluocinonide-emollient (LIDEX-E) 0.05 % cream Apply 1 application topically 2 (two) times daily.     Allergies:   Cat hair extract and Peanut-containing drug products   Social History   Socioeconomic History   Marital status: Married    Spouse name: Not on file   Number of children: 3   Years of education: Masters   Highest education level: Not on file  Occupational History    Employer: [employer]  Tobacco Use   Smoking status: Former    Types: Cigarettes    Quit date: 02/03/2013    Years since  quitting: 9.0   Smokeless tobacco: Never  Substance and Sexual Activity   Alcohol use: Yes    Alcohol/week: 2.0 standard drinks of alcohol    Types: 2 Cans of beer per week    Comment: daily   Drug use: No   Sexual activity: Yes    Partners: Female  Other Topics Concern   Not on file  Social History Narrative   Patient lives at home with his wife.   Patient has 3 children.   Patient works for [employer].   Patient is left-handed.   Patient has his Masters.   Patient drinks two cups of caffeine daily.   Social Determinants of Health   Financial Resource Strain: Not on file  Food Insecurity: Not on file  Transportation Needs: Not on file  Physical Activity: Not on file  Stress: Not on file  Social Connections: Not on file     Family History: The patient's family history includes Alcohol abuse in his paternal grandfather; Anxiety disorder in his daughter; COPD in his maternal grandmother; Cancer in  his maternal grandmother; Depression in his daughter; Diabetes in his maternal grandmother; Drug abuse in his paternal grandfather; Healthy in his brother, daughter, son, and son; Hearing loss in his father; Heart attack in his paternal grandfather; Heart disease in his maternal grandfather and paternal grandfather; Hyperlipidemia in his father, maternal grandmother, and mother; Hypertension in his maternal grandfather and maternal grandmother; Migraines in his mother.  ROS:   Please see the history of present illness.     All other systems reviewed and are negative.  EKGs/Labs/Other Studies Reviewed:    The following studies were reviewed today:   EKG:  EKG is  ordered today.  The ekg ordered today demonstrates   NSR, anterolateral TWI  02/27/2022- NSR, anterolateral TWI  Recent Labs: 02/21/2022: BUN 18; Creatinine, Ser 1.28; Hemoglobin 15.6; Platelets 169; Potassium 4.2; Sodium 142   Recent Lipid Panel    Component Value Date/Time   CHOL 190 01/17/2021 1053   TRIG 68.0 01/17/2021 1053   HDL 69.60 01/17/2021 1053   CHOLHDL 3 01/17/2021 1053   VLDL 13.6 01/17/2021 1053   LDLCALC 107 (H) 01/17/2021 1053     Risk Assessment/Calculations:           Physical Exam:    VS:   Vitals:   02/27/22 1351  BP: 116/60  Pulse: 68  SpO2: 99%     Wt Readings from Last 3 Encounters:  02/27/22 198 lb 6.4 oz (90 kg)  06/16/21 188 lb 9.6 oz (85.5 kg)  02/22/21 183 lb (83 kg)     GEN:  Well nourished, well developed in no acute distress HEENT: Normal NECK: No JVD; No carotid bruits LYMPHATICS: No lymphadenopathy CARDIAC: RRR, no murmurs, rubs, gallops RESPIRATORY:  Clear to auscultation without rales, wheezing or rhonchi  ABDOMEN: Soft, non-tender, non-distended MUSCULOSKELETAL:  No edema; No deformity  SKIN: Warm and dry NEUROLOGIC:  Alert and oriented x 3 PSYCHIATRIC:  Normal affect   ASSESSMENT:    #Palpitations: He does not have high risk features including syncope  c/f arrhythmia , family hx of SCD. His TWI are unchanged. We discussed reducing caffeine, staying hydrated incorporating fluids with electrolytes He had 1x PVC episode. Discussed if persistent will monitor and start BB.Marland Kitchen Discussed trying fluids with electrolytes first if this happens again. Discussed Kard[employer].Will rule out ischemia with exercise spect considering his age.  PLAN:    In order of problems listed above:  Exercise SPECT Follow up  6 months          Medication Adjustments/Labs and Tests Ordered: Current medicines are reviewed at length with the patient today.  Concerns regarding medicines are outlined above.  No orders of the defined types were placed in this encounter.  No orders of the defined types were placed in this encounter.   There are no Patient Instructions on file for this visit.   Signed, Janina Mayo, MD  02/27/2022 2:26 PM    [facility]

## 2022-02-27 NOTE — Telephone Encounter (Deleted)
Per CoverMyMeds:  PA was denied   Reason given: There is no indication your patient has a diagnosis of pain severe enough to require daily, aroundthe-clock, long-term opioid treatment. There is no indication that your patient has documented failure/inadequate response,  contraindication per FDA label, intolerance, or is not a candidate for a minimum one week trial of  immediate-release opioids*. *may need prior authorization. We have not received an opioid therapy management agreement signed by the individual (patient). This drug requires supportive documentation for all answers, including chart notes, lab/test results,  etc. This documentation was not included or was missing for some answers. Extended-release opioid analgesics are considered medically necessary when all of the following  criteria are met: Diagnosis of pain severe enough to require daily, around-the-clock, long-term  opioid treatment; Failure or inadequate response, contraindication per FDA label, intolerance, or  not a candidate for a minimum one week trial of immediate-release opioids; Opioid therapy  management agreement signed by the individual. Individuals with a documented diagnosis of  active cancer treatment (defined as receiving antineoplastic or antitumor therapy) requiring  treatment for cancer-related pain, end-of-life care (including hospice or palliative care), or sickle  cell disease are exempt from these criteria.

## 2022-02-27 NOTE — Patient Instructions (Signed)
Medication Instructions:  No Changes In Medications at this time.  *If you need a refill on your cardiac medications before your next appointment, please call your pharmacy*  Lab Work: None Ordered At This Time.  If you have labs (blood work) drawn today and your tests are completely normal, you will receive your results only by: [place] (if you have MyChart) OR A paper copy in the mail If you have any lab test that is abnormal or we need to change your treatment, we will call you to review the results.  Testing/Procedures: Your physician has requested that you have en exercise stress myoview. For further information please visit [email or web address]. Please follow instruction sheet, as given.  The test will take approximately 3 to 4 hours to complete; you may bring reading material.  If someone comes with you to your appointment, they will need to remain in the main lobby due to limited space in the testing area.    How to prepare for your Myocardial Perfusion Test: Do not eat or drink 3 hours prior to your test, except you may have water. Do not consume products containing caffeine (regular or decaffeinated) 12 hours prior to your test. (ex: coffee, chocolate, sodas, tea). Do wear comfortable clothes (no dresses or overalls) and walking shoes, tennis shoes preferred (No heels or open toe shoes are allowed). Do NOT wear cologne, perfume, aftershave, or lotions (deodorant is allowed). If you use an inhaler, use it the AM of your test and bring it with you.  If you use a nebulizer, use it the AM of your test.  If these instructions are not followed, your test will have to be rescheduled.  Follow-Up: At [facility], you and your health needs are our priority.  As part of our continuing mission to provide you with exceptional heart care, we have created designated Provider Care Teams.  These Care Teams include your primary Cardiologist (physician) and Advanced Practice  Providers (APPs -  Physician Assistants and Nurse Practitioners) who all work together to provide you with the care you need, when you need it.  Your next appointment:   6 month(s)  The format for your next appointment:   In Person  Provider:   Janina Mayo, MD

## 2022-02-27 NOTE — Telephone Encounter (Signed)
Transition Care Management Unsuccessful Follow-up Telephone Call  Date of discharge and from where:  [facility]  Attempts:  2nd Attempt  Reason for unsuccessful TCM follow-up call:  Left voice message

## 2022-03-01 ENCOUNTER — Encounter (HOSPITAL_COMMUNITY): Payer: Self-pay | Admitting: *Deleted

## 2022-03-08 ENCOUNTER — Encounter (HOSPITAL_COMMUNITY): Payer: BC Managed Care – PPO

## 2022-03-14 ENCOUNTER — Ambulatory Visit (HOSPITAL_COMMUNITY): Payer: BC Managed Care – PPO | Attending: Internal Medicine

## 2022-03-14 DIAGNOSIS — R9431 Abnormal electrocardiogram [ECG] [EKG]: Secondary | ICD-10-CM | POA: Diagnosis present

## 2022-03-14 LAB — MYOCARDIAL PERFUSION IMAGING
Angina Index: 0
Duke Treadmill Score: 9
Estimated workload: 10.5
Exercise duration (min): 9 min
Exercise duration (sec): 16 s
LV dias vol: 92 mL (ref 62–150)
LV sys vol: 43 mL
MPHR: 164 {beats}/min
Nuc Stress EF: 53 %
Peak HR: 157 {beats}/min
Percent HR: 95 %
Rest HR: 55 {beats}/min
Rest Nuclear Isotope Dose: 10.7 mCi
SDS: 2
SRS: 0
SSS: 2
ST Depression (mm): 0 mm
Stress Nuclear Isotope Dose: 32.6 mCi
TID: 1.12

## 2022-03-14 MED ORDER — TECHNETIUM TC 99M TETROFOSMIN IV KIT
10.7000 | PACK | Freq: Once | INTRAVENOUS | Status: AC | PRN
  Administered 2022-03-14: 10.7 via INTRAVENOUS

## 2022-03-14 MED ORDER — TECHNETIUM TC 99M TETROFOSMIN IV KIT
32.6000 | PACK | Freq: Once | INTRAVENOUS | Status: AC | PRN
  Administered 2022-03-14: 32.6 via INTRAVENOUS

## 2022-03-22 ENCOUNTER — Telehealth: Payer: Self-pay | Admitting: Internal Medicine

## 2022-03-22 NOTE — Telephone Encounter (Signed)
Pt went to [facility] ED 11.7.23 and had labs during his visit. At [facility] suggestion pt has made a visit to follow up Dr. Ronnald Ramp 1.3.23, and is requesting to have another set of labs ordered so the results can be compared during his OV on 1.3.23.  Please call pt to confirm:  [phone]

## 2022-04-04 ENCOUNTER — Encounter: Payer: Self-pay | Admitting: Internal Medicine

## 2022-04-04 ENCOUNTER — Ambulatory Visit: Payer: BC Managed Care – PPO | Admitting: Internal Medicine

## 2022-04-04 VITALS — BP 128/80 | HR 60 | Temp 97.8°F | Resp 16 | Ht 69.0 in | Wt 197.0 lb

## 2022-04-04 DIAGNOSIS — Z125 Encounter for screening for malignant neoplasm of prostate: Secondary | ICD-10-CM

## 2022-04-04 DIAGNOSIS — I4729 Other ventricular tachycardia: Secondary | ICD-10-CM

## 2022-04-04 DIAGNOSIS — R739 Hyperglycemia, unspecified: Secondary | ICD-10-CM | POA: Diagnosis not present

## 2022-04-04 DIAGNOSIS — Z0001 Encounter for general adult medical examination with abnormal findings: Secondary | ICD-10-CM

## 2022-04-04 DIAGNOSIS — Z23 Encounter for immunization: Secondary | ICD-10-CM | POA: Diagnosis not present

## 2022-04-04 DIAGNOSIS — R7989 Other specified abnormal findings of blood chemistry: Secondary | ICD-10-CM | POA: Diagnosis not present

## 2022-04-04 LAB — URINALYSIS, ROUTINE W REFLEX MICROSCOPIC
Bilirubin Urine: NEGATIVE
Hgb urine dipstick: NEGATIVE
Ketones, ur: NEGATIVE
Leukocytes,Ua: NEGATIVE
Nitrite: NEGATIVE
RBC / HPF: NONE SEEN (ref 0–?)
Specific Gravity, Urine: <=1.005 — AB (ref 1.000–1.030)
Total Protein, Urine: NEGATIVE
Urine Glucose: NEGATIVE
Urobilinogen, UA: 0.2 (ref 0.0–1.0)
WBC, UA: NONE SEEN (ref 0–?)
pH: 6.5 (ref 5.0–8.0)

## 2022-04-04 LAB — HEPATIC FUNCTION PANEL
ALT: 23 U/L (ref 0–53)
AST: 18 U/L (ref 0–37)
Albumin: 4.7 g/dL (ref 3.5–5.2)
Alkaline Phosphatase: 39 U/L (ref 39–117)
Bilirubin, Direct: 0.1 mg/dL (ref 0.0–0.3)
Total Bilirubin: 0.8 mg/dL (ref 0.2–1.2)
Total Protein: 7.3 g/dL (ref 6.0–8.3)

## 2022-04-04 LAB — BASIC METABOLIC PANEL
BUN: 18 mg/dL (ref 6–23)
CO2: 30 mEq/L (ref 19–32)
Calcium: 9.9 mg/dL (ref 8.4–10.5)
Chloride: 99 mEq/L (ref 96–112)
Creatinine, Ser: 1.09 mg/dL (ref 0.40–1.50)
GFR: 75.97 mL/min (ref >60.00)
Glucose, Bld: 95 mg/dL (ref 70–99)
Potassium: 5 mEq/L (ref 3.5–5.1)
Sodium: 136 mEq/L (ref 135–145)

## 2022-04-04 LAB — HEMOGLOBIN A1C: Hgb A1c MFr Bld: 5.7 % (ref 4.6–6.5)

## 2022-04-04 LAB — PSA: PSA: 0.56 ng/mL (ref 0.10–4.00)

## 2022-04-04 LAB — TSH: TSH: 2.37 u[IU]/mL (ref 0.35–5.50)

## 2022-04-04 NOTE — Patient Instructions (Signed)
Health Maintenance, Male Adopting a healthy lifestyle and getting preventive care are important in promoting health and wellness. Ask your health care provider about: The right schedule for you to have regular tests and exams. Things you can do on your own to prevent diseases and keep yourself healthy. What should I know about diet, weight, and exercise? Eat a healthy diet  Eat a diet that includes plenty of vegetables, fruits, low-fat dairy products, and lean protein. Do not eat a lot of foods that are high in solid fats, added sugars, or sodium. Maintain a healthy weight Body mass index (BMI) is a measurement that can be used to identify possible weight problems. It estimates body fat based on height and weight. Your health care provider can help determine your BMI and help you achieve or maintain a healthy weight. Get regular exercise Get regular exercise. This is one of the most important things you can do for your health. Most adults should: Exercise for at least 150 minutes each week. The exercise should increase your heart rate and make you sweat (moderate-intensity exercise). Do strengthening exercises at least twice a week. This is in addition to the moderate-intensity exercise. Spend less time sitting. Even light physical activity can be beneficial. Watch cholesterol and blood lipids Have your blood tested for lipids and cholesterol at 56 years of age, then have this test every 5 years. You may need to have your cholesterol levels checked more often if: Your lipid or cholesterol levels are high. You are older than 56 years of age. You are at high risk for heart disease. What should I know about cancer screening? Many types of cancers can be detected early and may often be prevented. Depending on your health history and family history, you may need to have cancer screening at various ages. This may include screening for: Colorectal cancer. Prostate cancer. Skin cancer. Lung  cancer. What should I know about heart disease, diabetes, and high blood pressure? Blood pressure and heart disease High blood pressure causes heart disease and increases the risk of stroke. This is more likely to develop in people who have high blood pressure readings or are overweight. Talk with your health care provider about your target blood pressure readings. Have your blood pressure checked: Every 3-5 years if you are 18-39 years of age. Every year if you are 40 years old or older. If you are between the ages of 65 and 75 and are a current or former smoker, ask your health care provider if you should have a one-time screening for abdominal aortic aneurysm (AAA). Diabetes Have regular diabetes screenings. This checks your fasting blood sugar level. Have the screening done: Once every three years after age 45 if you are at a normal weight and have a low risk for diabetes. More often and at a younger age if you are overweight or have a high risk for diabetes. What should I know about preventing infection? Hepatitis B If you have a higher risk for hepatitis B, you should be screened for this virus. Talk with your health care provider to find out if you are at risk for hepatitis B infection. Hepatitis C Blood testing is recommended for: Everyone born from 1945 through 1965. Anyone with known risk factors for hepatitis C. Sexually transmitted infections (STIs) You should be screened each year for STIs, including gonorrhea and chlamydia, if: You are sexually active and are younger than 56 years of age. You are older than 56 years of age and your   health care provider tells you that you are at risk for this type of infection. Your sexual activity has changed since you were last screened, and you are at increased risk for chlamydia or gonorrhea. Ask your health care provider if you are at risk. Ask your health care provider about whether you are at high risk for HIV. Your health care provider  may recommend a prescription medicine to help prevent HIV infection. If you choose to take medicine to prevent HIV, you should first get tested for HIV. You should then be tested every 3 months for as long as you are taking the medicine. Follow these instructions at home: Alcohol use Do not drink alcohol if your health care provider tells you not to drink. If you drink alcohol: Limit how much you have to 0-2 drinks a day. Know how much alcohol is in your drink. In the [place] one drink equals one 12 oz bottle of beer (355 mL), one 5 oz glass of wine (148 mL), or one 1 oz glass of hard liquor (44 mL). Lifestyle Do not use any products that contain nicotine or tobacco. These products include cigarettes, chewing tobacco, and vaping devices, such as e-cigarettes. If you need help quitting, ask your health care provider. Do not use street drugs. Do not share needles. Ask your health care provider for help if you need support or information about quitting drugs. General instructions Schedule regular health, dental, and eye exams. Stay current with your vaccines. Tell your health care provider if: You often feel depressed. You have ever been abused or do not feel safe at home. Summary Adopting a healthy lifestyle and getting preventive care are important in promoting health and wellness. Follow your health care provider's instructions about healthy diet, exercising, and getting tested or screened for diseases. Follow your health care provider's instructions on monitoring your cholesterol and blood pressure. This information is not intended to replace advice given to you by your health care provider. Make sure you discuss any questions you have with your health care provider. Document Revised: 08/23/2020 Document Reviewed: 08/23/2020 Elsevier Patient Education  2023 Elsevier Inc.  

## 2022-04-04 NOTE — Progress Notes (Signed)
Subjective:  Patient ID: Albert Schmidt, male    DOB: 19-Aug-1965  Age: 56 y.o. MRN: [id number]  CC: Annual Exam and Follow-up   HPI Albert Schmidt presents for a CPX and f/up -  About a month ago he developed palpitations and was seen in the ED.  His symptoms have resolved and he has had a cardiac workup.  At the time he was found to have an elevated creatinine.  He feels well today and offers no complaints.  Outpatient Medications Prior to Visit  Medication Sig Dispense Refill   acetaminophen (TYLENOL) 500 MG tablet Take 500 mg by mouth every 6 (six) hours as needed.     fluocinonide-emollient (LIDEX-E) 0.05 % cream Apply 1 application topically 2 (two) times daily. 60 g 2   No facility-administered medications prior to visit.    ROS Review of Systems  Constitutional: Negative.  Negative for diaphoresis and fatigue.  HENT: Negative.    Eyes: Negative.   Respiratory:  Negative for cough, chest tightness, shortness of breath and wheezing.   Cardiovascular:  Negative for chest pain, palpitations and leg swelling.  Gastrointestinal:  Negative for abdominal pain, constipation, diarrhea, nausea and vomiting.  Endocrine: Negative.   Genitourinary: Negative.  Negative for difficulty urinating.  Musculoskeletal: Negative.  Negative for arthralgias, joint swelling and myalgias.  Skin: Negative.   Neurological: Negative.  Negative for dizziness, weakness and light-headedness.  Hematological:  Negative for adenopathy. Does not bruise/bleed easily.  Psychiatric/Behavioral: Negative.  The patient is not nervous/anxious.     Objective:  BP 128/80 (BP Location: Right Arm, Patient Position: Sitting, Cuff Size: Large)   Pulse 60   Temp 97.8 F (36.6 C) (Oral)   Resp 16   Ht '5\' 9"'$  (1.753 m)   Wt 197 lb (89.4 kg)   SpO2 99%   BMI 29.09 kg/m   BP Readings from Last 3 Encounters:  04/04/22 128/80  02/27/22 116/60  02/21/22 122/79    Wt Readings from Last 3 Encounters:  04/04/22  197 lb (89.4 kg)  02/27/22 198 lb 6.4 oz (90 kg)  06/16/21 188 lb 9.6 oz (85.5 kg)    Physical Exam Vitals reviewed.  Constitutional:      Appearance: Normal appearance.  HENT:     Nose: Nose normal.     Mouth/Throat:     Mouth: Mucous membranes are moist.  Eyes:     General: No scleral icterus.    Conjunctiva/sclera: Conjunctivae normal.  Cardiovascular:     Rate and Rhythm: Normal rate and regular rhythm.     Heart sounds: No murmur heard. Pulmonary:     Effort: Pulmonary effort is normal.     Breath sounds: No stridor. No wheezing, rhonchi or rales.  Abdominal:     General: Abdomen is flat.     Palpations: There is no mass.     Tenderness: There is no abdominal tenderness. There is no guarding.     Hernia: No hernia is present. There is no hernia in the left inguinal area or right inguinal area.  Genitourinary:    Pubic Area: No rash.      Penis: Normal and circumcised.      Testes: Normal.     Epididymis:     Right: Normal.     Left: Normal.     Prostate: Normal. Not enlarged, not tender and no nodules present.     Rectum: Normal. Guaiac result negative. No mass, tenderness, anal fissure, external hemorrhoid or internal hemorrhoid. Normal  anal tone.  Musculoskeletal:        General: Normal range of motion.     Cervical back: Neck supple.     Right lower leg: No edema.     Left lower leg: No edema.  Lymphadenopathy:     Cervical: No cervical adenopathy.     Lower Body: No right inguinal adenopathy. No left inguinal adenopathy.  Skin:    General: Skin is warm and dry.  Neurological:     General: No focal deficit present.     Mental Status: He is alert. Mental status is at baseline.  Psychiatric:        Mood and Affect: Mood normal.        Behavior: Behavior normal.     Lab Results  Component Value Date   WBC 8.7 02/21/2022   HGB 15.6 02/21/2022   HCT 46.8 02/21/2022   PLT 169 02/21/2022   GLUCOSE 95 04/04/2022   CHOL 190 01/17/2021   TRIG 68.0  01/17/2021   HDL 69.60 01/17/2021   LDLCALC 107 (H) 01/17/2021   ALT 23 04/04/2022   AST 18 04/04/2022   NA 136 04/04/2022   K 5.0 04/04/2022   CL 99 04/04/2022   CREATININE 1.09 04/04/2022   BUN 18 04/04/2022   CO2 30 04/04/2022   TSH 2.37 04/04/2022   PSA 0.56 04/04/2022   INR 1.1 (H) 02/22/2021   HGBA1C 5.7 04/04/2022    DG Chest 2 View  Result Date: 02/21/2022 CLINICAL DATA:  Irregular heart beat. EXAM: CHEST - 2 VIEW COMPARISON:  None Available. FINDINGS: The heart size and mediastinal contours are within normal limits. Both lungs are clear. The visualized skeletal structures are unremarkable. IMPRESSION: No active cardiopulmonary disease. Electronically Signed   By: Virgina Norfolk M.D.   On: 02/21/2022 02:31    Assessment & Plan:   Albert Schmidt was seen today for annual exam and follow-up.  Diagnoses and all orders for this visit:  Elevated serum creatinine- His creatinine is normal now.  GFR is normal at 84. -     Urinalysis, Routine w reflex microscopic; Future -     Hepatic function panel; Future -     Basic metabolic panel; Future -     Basic metabolic panel -     Hepatic function panel -     Urinalysis, Routine w reflex microscopic  Encounter for general adult medical examination with abnormal findings- Exam completed, labs reviewed, vaccines reviewed and updated, cancer screenings are up-to-date, patient education was given. -     PSA; Future -     PSA  Hyperglycemia- His A1c is normal. -     Hemoglobin A1c; Future -     Hepatic function panel; Future -     Hepatic function panel -     Hemoglobin A1c  Ventricular tachycardia (paroxysmal) ([place])- Labs are negative for secondary causes. -     TSH; Future -     Hepatic function panel; Future -     Hepatic function panel -     TSH  Other orders -     Zoster Recombinant (Shingrix )   I am having Albert Schmidt "Albert Schmidt" maintain his fluocinonide-emollient and acetaminophen.  No orders of the defined types  were placed in this encounter.    Follow-up: Return in about 6 months (around 10/04/2022).  Albert Calico, MD

## 2022-04-19 ENCOUNTER — Ambulatory Visit: Payer: BC Managed Care – PPO | Admitting: Internal Medicine

## 2022-04-25 ENCOUNTER — Ambulatory Visit: Payer: BC Managed Care – PPO | Admitting: Internal Medicine

## 2022-07-24 ENCOUNTER — Ambulatory Visit (INDEPENDENT_AMBULATORY_CARE_PROVIDER_SITE_OTHER): Payer: BC Managed Care – PPO

## 2022-07-24 DIAGNOSIS — Z23 Encounter for immunization: Secondary | ICD-10-CM

## 2022-07-24 NOTE — Progress Notes (Signed)
After obtaining consent, and per orders of Dr. Jones, injection of Shingle given by Greogory Cornette P Jadore Veals. Patient instructed to report any adverse reaction to me immediately.  

## 2022-08-13 IMAGING — DX DG SHOULDER 1V*L*
1 series · 3 of 3 positions shown · non-contrast
Comparison: January 24, 2020

CLINICAL DATA: Post reduction

EXAM:
LEFT SHOULDER

[Series 1: shoulder · 0.14mm/px · 3 of 3 slices shown]
[im 1/3]
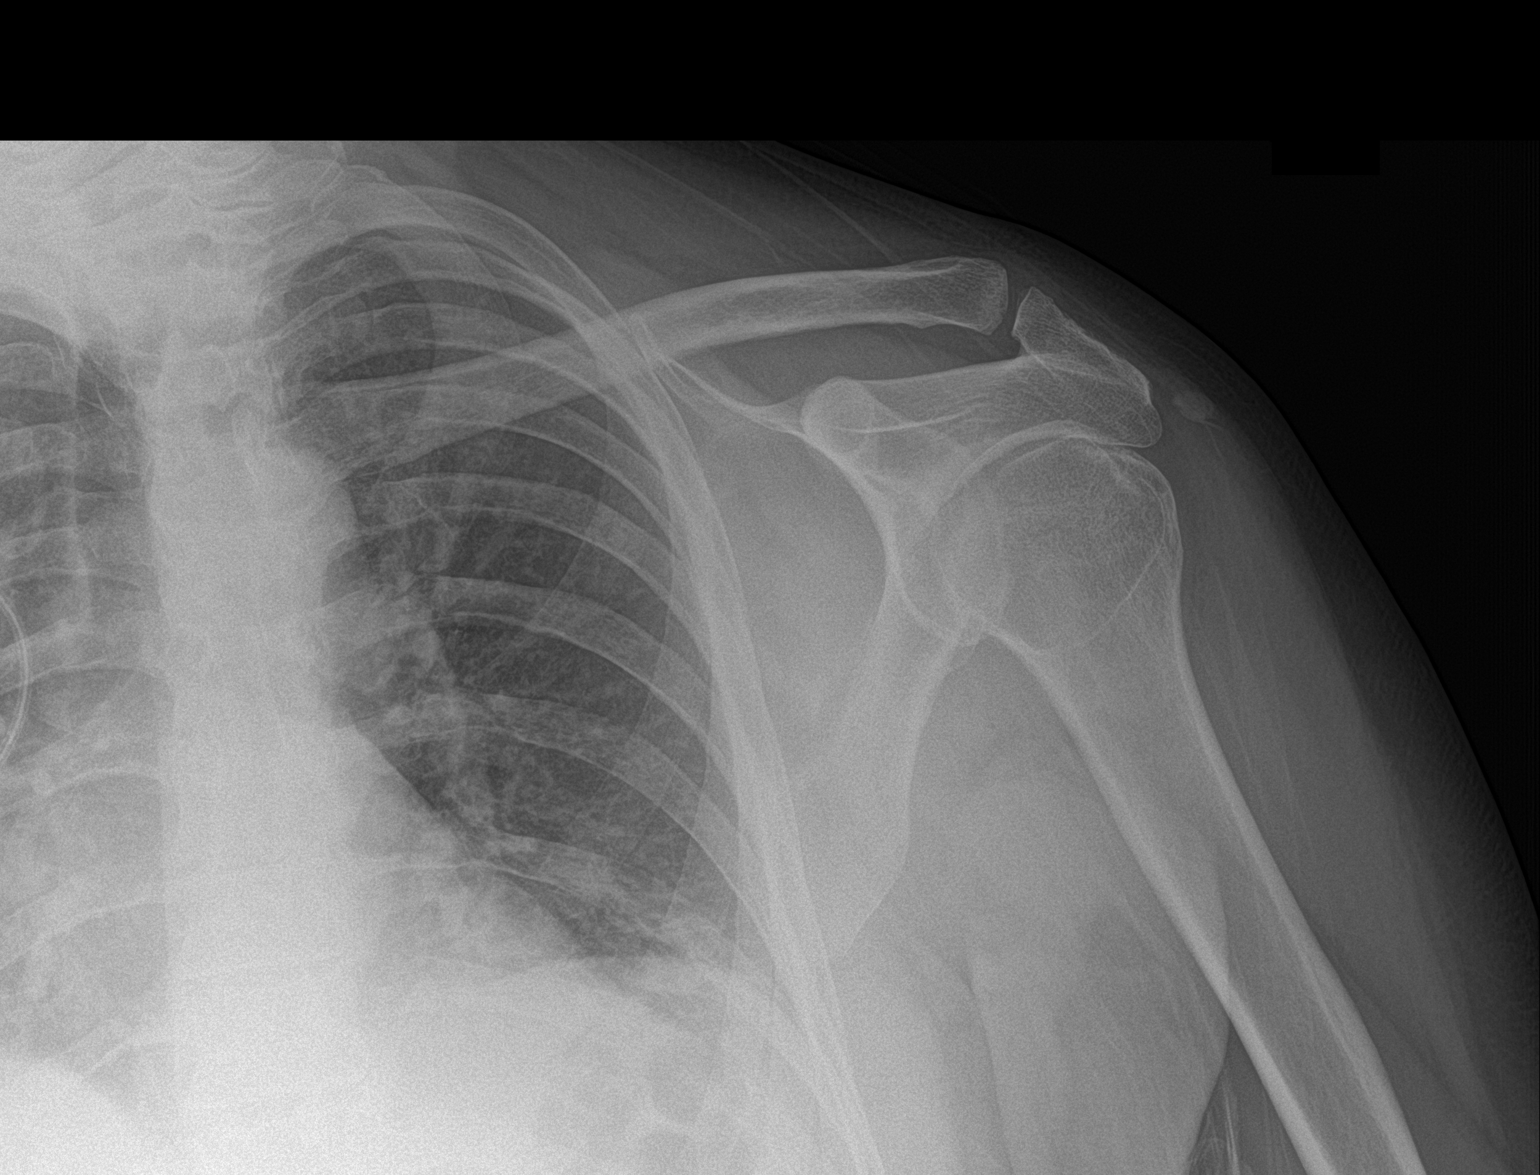
[im 2/3]
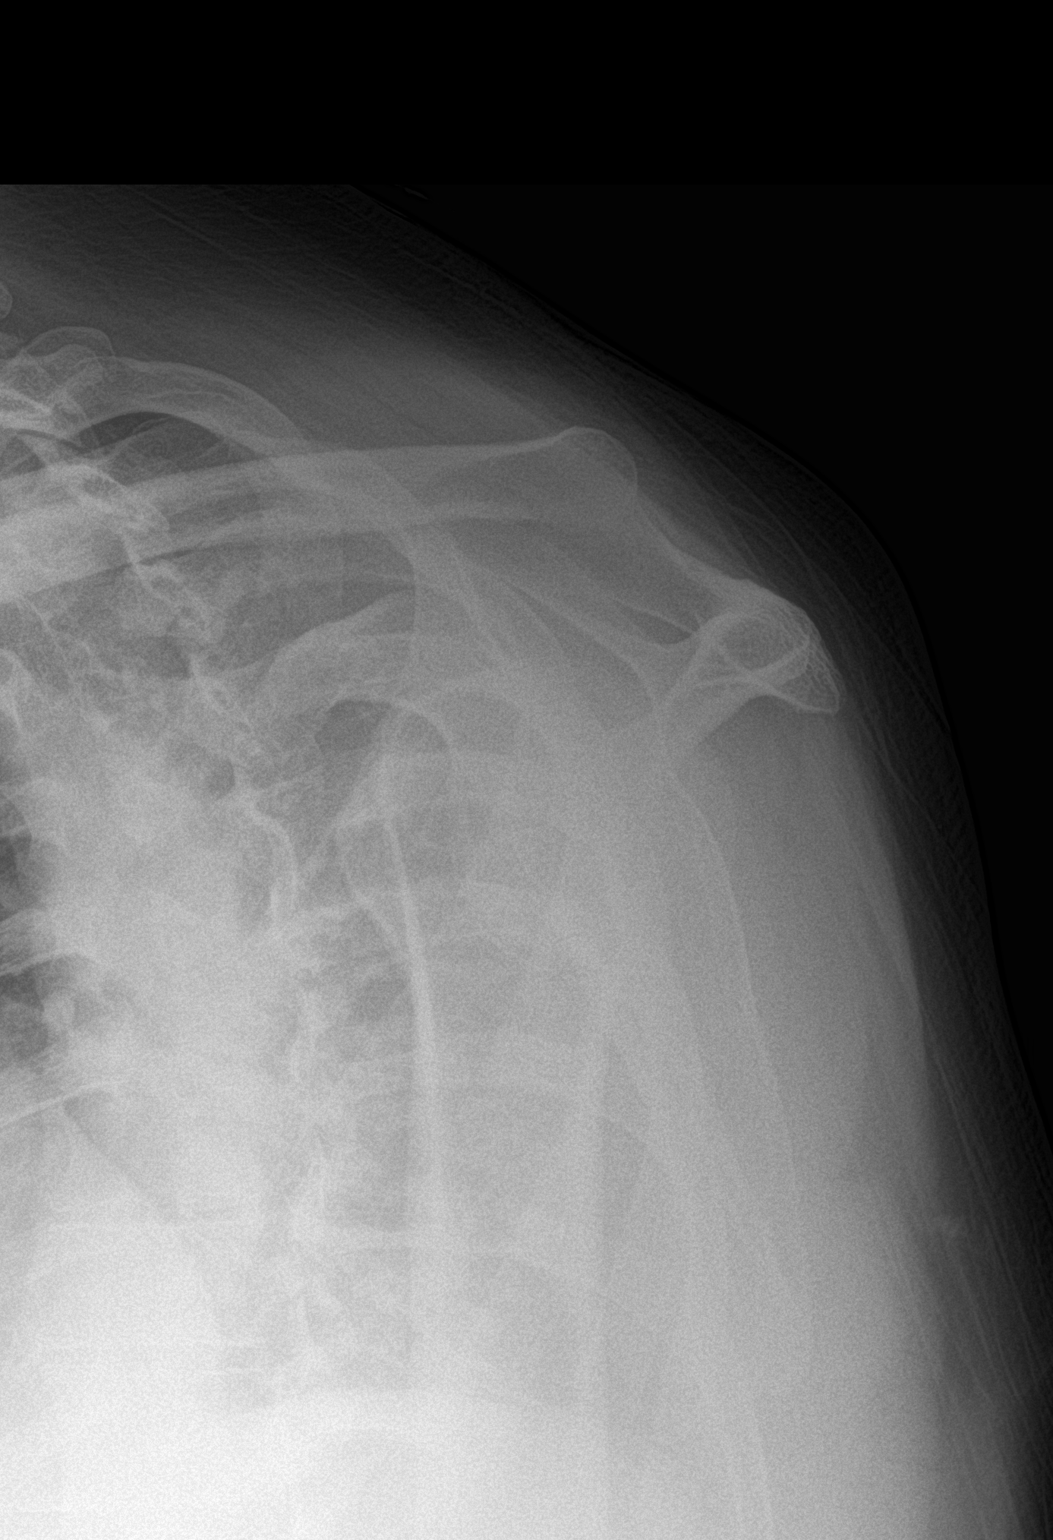
[im 3/3]
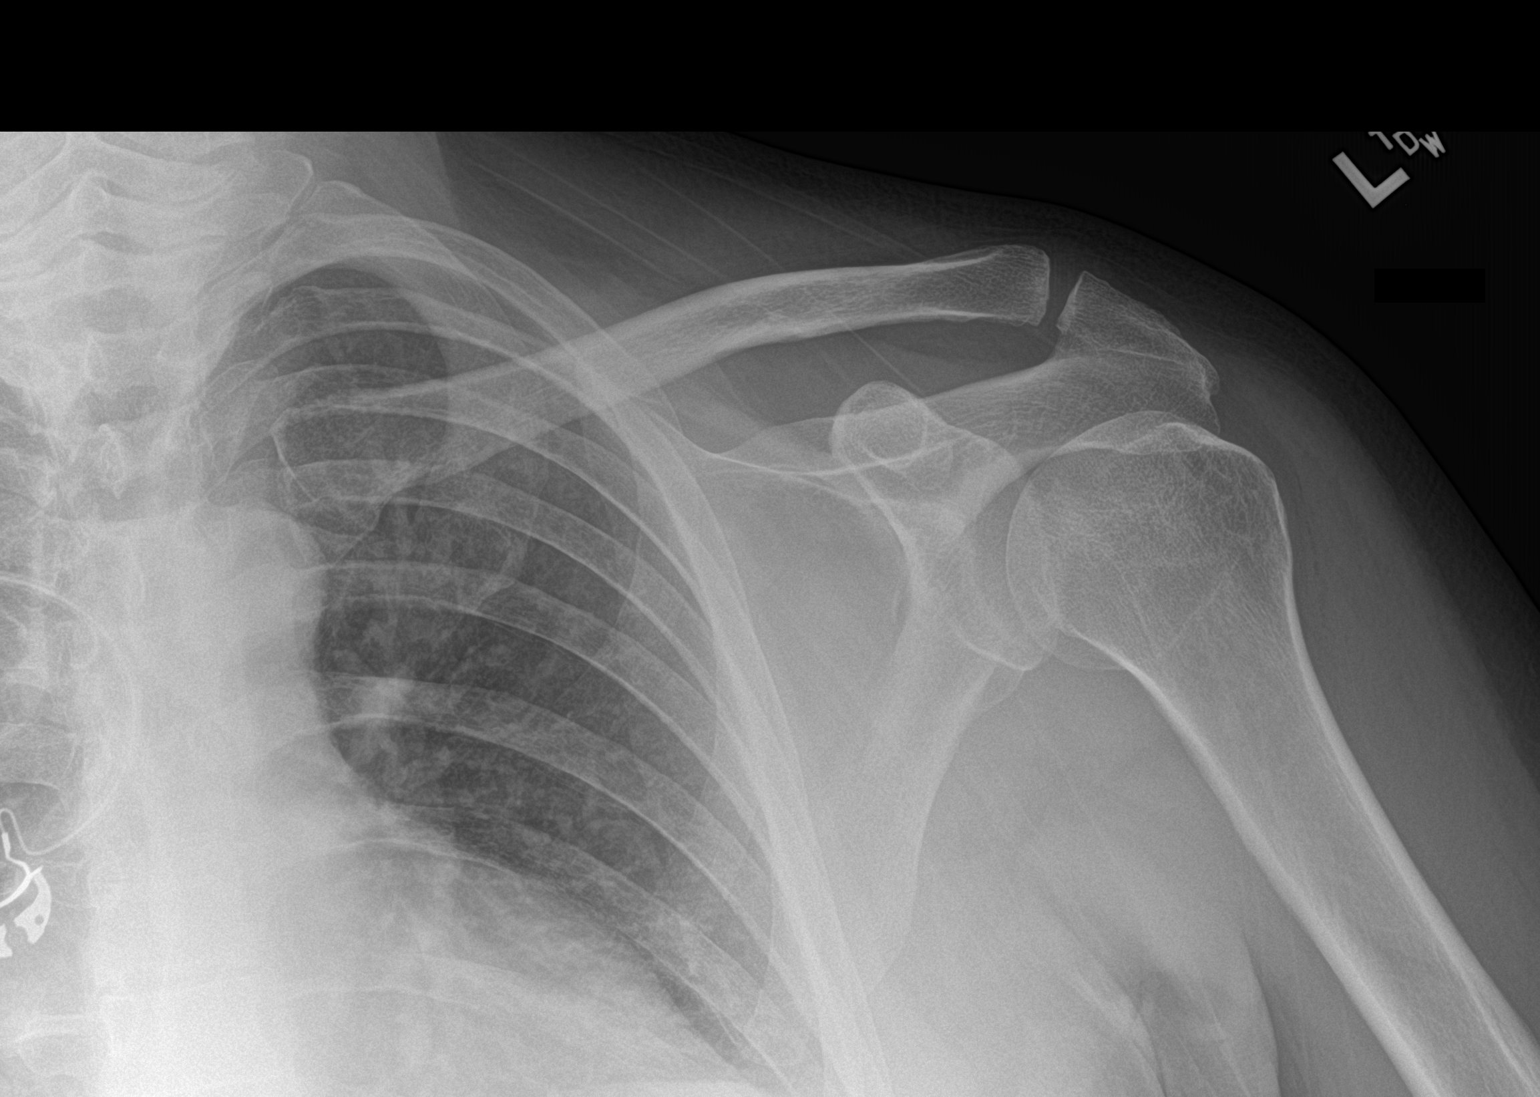

[3 of 3 positions shown; findings below may reference images not displayed]

FINDINGS: Interval relocation of the LEFT shoulder. [place]-Sachs
deformity. No definitive glenoid fracture visualized. No additional
fracture noted. Soft tissues are unremarkable.
IMPRESSION: Interval relocation of the LEFT shoulder.

## 2022-08-29 ENCOUNTER — Ambulatory Visit: Payer: BC Managed Care – PPO | Attending: Internal Medicine | Admitting: Internal Medicine

## 2022-08-29 ENCOUNTER — Encounter: Payer: Self-pay | Admitting: Internal Medicine

## 2022-08-29 VITALS — BP 133/85 | HR 59 | Ht 69.0 in | Wt 199.2 lb

## 2022-08-29 DIAGNOSIS — R002 Palpitations: Secondary | ICD-10-CM | POA: Diagnosis not present

## 2022-08-29 NOTE — Progress Notes (Signed)
Cardiology Office Note:    Date:  08/29/2022   ID:  Albert Schmidt, DOB 06-20-1965, MRN [id number]  PCP:  Etta Grandchild, MD   [facility] Providers Cardiologist:  Maisie Fus, MD     Referring MD: Etta Grandchild, MD   No chief complaint on file. Palpitations  History of Present Illness:    Albert Schmidt is a 57 y.o. male with a hx of palpitations, referral from Dr. Yetta Barre for palpitations and review of cardiac studies  In November, he noted palpitations. He still has some palpitations but they have improved. He notes fainting before after he injured his finger at age 60 or 52. No cardiac history. No stress tests in the past. He has no HTN, no DM2.  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 His echo showed mild LVH, no concern for HCM. He has no valve dx.  When he was in graduate school, he was under stress. He went to [facility] and saw a physician. Got an EKG which showed anterolateral TWI at age 70.   He notes his mother and brother have TWI. MGF had a pacemaker. PGF had an MI, heavy smoker.   Labs 02/22/2021 TSH normal  EKG- 02/22/2021: NSR, anterolateral TWI   The 10-year ASCVD risk score (Arnett DK, et al., 2019) is: 4.7%   Values used to calculate the score:     Age: 49 years     Sex: Male     Is Non-Hispanic African American: No     Diabetic: No     Tobacco smoker: No     Systolic Blood Pressure: 133 mmHg     Is BP treated: No     HDL Cholesterol: 69.6 mg/dL     Total Cholesterol: 190 mg/dL   Interim Hx 23/76 Albert Schmidt is a 57 y.o. male with a hx of asthma, had palpitations in the early AM 11/7. Had PVCs on his EKG. Crt mildly up. No sig trop. No etoh. No drugs. He was dehydrated.  No anemia. No other symptoms. He's asymptomatic currenlty  11/9 ECG- PVCs   Interim hx 08/28/2012 No palpitations. He is lifting weights and on the elliptical.  He feels well, no issues.  Past Medical History:  Diagnosis Date   Allergy     Arrhythmia    Asthma    Avascular necrosis of femoral head (HCC)    Bilaterally-    Dislocated shoulder    left, 1993, 2021   Frequent headaches     Past Surgical History:  Procedure Laterality Date   BONE TUMOR EXCISION  1982   CYSTECTOMY  1992   TONSILLECTOMY  1974   VASECTOMY  04/17/1990   WISDOM TOOTH EXTRACTION  1990    Current Medications: Current Meds  Medication Sig   acetaminophen (TYLENOL) 500 MG tablet Take 500 mg by mouth every 6 (six) hours as needed.   fluocinonide-emollient (LIDEX-E) 0.05 % cream Apply 1 application topically 2 (two) times daily.     Allergies:   Cat hair extract and Peanut-containing drug products   Social History   Socioeconomic History   Marital status: Married    Spouse name: Not on file   Number of children: 3   Years of education: Masters   Highest education level: Not on file  Occupational History    Employer: [employer]  Tobacco Use   Smoking status: Former    Types: Cigarettes    Quit date: 02/03/2013  Years since quitting: 9.5   Smokeless tobacco: Never  Substance and Sexual Activity   Alcohol use: Yes    Alcohol/week: 2.0 standard drinks of alcohol    Types: 2 Cans of beer per week    Comment: daily   Drug use: No   Sexual activity: Yes    Partners: Female  Other Topics Concern   Not on file  Social History Narrative   Patient lives at home with his wife.   Patient has 3 children.   Patient works for [employer].   Patient is left-handed.   Patient has his Masters.   Patient drinks two cups of caffeine daily.   Social Determinants of Health   Financial Resource Strain: Not on file  Food Insecurity: Not on file  Transportation Needs: Not on file  Physical Activity: Not on file  Stress: Not on file  Social Connections: Not on file     Family History: The patient's family history includes Alcohol abuse in his paternal grandfather; Anxiety disorder in his daughter; COPD in his maternal grandmother; Cancer in  his maternal grandmother; Depression in his daughter; Diabetes in his maternal grandmother; Drug abuse in his paternal grandfather; Healthy in his brother, daughter, son, and son; Hearing loss in his father; Heart attack in his paternal grandfather; Heart disease in his maternal grandfather and paternal grandfather; Hyperlipidemia in his father, maternal grandmother, and mother; Hypertension in his maternal grandfather and maternal grandmother; Migraines in his mother.  ROS:   Please see the history of present illness.     All other systems reviewed and are negative.  EKGs/Labs/Other Studies Reviewed:    The following studies were reviewed today:   EKG:  EKG is  ordered today.  The ekg ordered today demonstrates   NSR, anterolateral TWI  02/27/2022- NSR, anterolateral TWI  08/29/2022- NSR, TWI anterolateral  EKG- 02/22/2021: NSR, anterolateral TWI  Cardiac monitor- sinus rhythm, brief NSVT, no atrial fibrillation  TTE 05/26/2021- normal EF, mild LVH, no valve disease  Exercise SPECT: no ischemia or infarcts, PVCs. 10 METS , good exercise capacity  Recent Labs: 02/21/2022: Hemoglobin 15.6; Platelets 169 04/04/2022: ALT 23; BUN 18; Creatinine, Ser 1.09; Potassium 5.0; Sodium 136; TSH 2.37   Recent Lipid Panel    Component Value Date/Time   CHOL 190 01/17/2021 1053   TRIG 68.0 01/17/2021 1053   HDL 69.60 01/17/2021 1053   CHOLHDL 3 01/17/2021 1053   VLDL 13.6 01/17/2021 1053   LDLCALC 107 (H) 01/17/2021 1053     Risk Assessment/Calculations:           Physical Exam:    VS:  Vitals:   08/29/22 0913  BP: 133/85  Pulse: (!) 59  SpO2: 97%    Wt Readings from Last 3 Encounters:  08/29/22 199 lb 3.2 oz (90.4 kg)  04/04/22 197 lb (89.4 kg)  02/27/22 198 lb 6.4 oz (90 kg)     GEN:  Well nourished, well developed in no acute distress HEENT: Normal NECK: No JVD; No carotid bruits LYMPHATICS: No lymphadenopathy CARDIAC: RRR, no murmurs, rubs, gallops RESPIRATORY:   Clear to auscultation without rales, wheezing or rhonchi  ABDOMEN: Soft, non-tender, non-distended MUSCULOSKELETAL:  No edema; No deformity  SKIN: Warm and dry NEUROLOGIC:  Alert and oriented x 3 PSYCHIATRIC:  Normal affect   ASSESSMENT:    #PVCs/ diffuse biphasic T wave changes: normal SPECT. He feels well, no palpitations. If any issues were to arise, will be happy to see him again, otherwise will see as  needed.  PLAN:    In order of problems listed above:  Follow up PRN  Medication Adjustments/Labs and Tests Ordered: Current medicines are reviewed at length with the patient today.  Concerns regarding medicines are outlined above.  No orders of the defined types were placed in this encounter.  No orders of the defined types were placed in this encounter.   Patient Instructions  Medication Instructions:  Continue same medications   Lab Work: None ordered   Testing/Procedures: None ordered   Follow-Up: At [facility], you and your health needs are our priority.  As part of our continuing mission to provide you with exceptional heart care, we have created designated Provider Care Teams.  These Care Teams include your primary Cardiologist (physician) and Advanced Practice Providers (APPs -  Physician Assistants and Nurse Practitioners) who all work together to provide you with the care you need, when you need it.  We recommend signing up for the patient portal called "MyChart".  Sign up information is provided on this After Visit Summary.  MyChart is used to connect with patients for Virtual Visits (Telemedicine).  Patients are able to view lab/test results, encounter notes, upcoming appointments, etc.  Non-urgent messages can be sent to your provider as well.   To learn more about what you can do with MyChart, go to [email or web address].    Your next appointment:  As Needed    Provider:  Dr.Javis Abboud     Signed, Maisie Fus, MD  08/29/2022 12:45 PM     [facility]

## 2022-08-29 NOTE — Patient Instructions (Signed)
Medication Instructions:  Continue same medications   Lab Work: None ordered   Testing/Procedures: None ordered   Follow-Up: At [facility], you and your health needs are our priority.  As part of our continuing mission to provide you with exceptional heart care, we have created designated Provider Care Teams.  These Care Teams include your primary Cardiologist (physician) and Advanced Practice Providers (APPs -  Physician Assistants and Nurse Practitioners) who all work together to provide you with the care you need, when you need it.  We recommend signing up for the patient portal called "MyChart".  Sign up information is provided on this After Visit Summary.  MyChart is used to connect with patients for Virtual Visits (Telemedicine).  Patients are able to view lab/test results, encounter notes, upcoming appointments, etc.  Non-urgent messages can be sent to your provider as well.   To learn more about what you can do with MyChart, go to [email or web address].    Your next appointment:  As Needed    Provider:  Dr.Branch

## 2022-08-31 ENCOUNTER — Encounter: Payer: Self-pay | Admitting: Internal Medicine

## 2022-09-01 ENCOUNTER — Encounter: Payer: Self-pay | Admitting: Internal Medicine

## 2022-09-01 ENCOUNTER — Encounter: Payer: Self-pay | Admitting: Radiology

## 2022-12-19 ENCOUNTER — Encounter: Payer: Self-pay | Admitting: Internal Medicine

## 2023-06-27 ENCOUNTER — Encounter: Payer: Self-pay | Admitting: Internal Medicine
